# Patient Record
Sex: Female | Born: 1960 | Race: White | Hispanic: No | Marital: Single | Smoking: Never smoker
Health system: Southern US, Community
[De-identification: ages and names within clinical notes are randomized; demographics above are authoritative.]

## PROBLEM LIST (undated history)

## (undated) HISTORY — PX: NO PAST SURGERIES: SHX2092

---

## 2015-02-14 ENCOUNTER — Other Ambulatory Visit: Payer: Self-pay | Admitting: Internal Medicine

## 2015-02-15 ENCOUNTER — Encounter: Payer: Self-pay | Admitting: Internal Medicine

## 2015-02-15 ENCOUNTER — Other Ambulatory Visit: Payer: Self-pay | Admitting: Internal Medicine

## 2015-02-15 DIAGNOSIS — R739 Hyperglycemia, unspecified: Secondary | ICD-10-CM | POA: Insufficient documentation

## 2015-02-15 DIAGNOSIS — E282 Polycystic ovarian syndrome: Secondary | ICD-10-CM | POA: Insufficient documentation

## 2015-02-15 DIAGNOSIS — E785 Hyperlipidemia, unspecified: Secondary | ICD-10-CM | POA: Insufficient documentation

## 2015-02-15 DIAGNOSIS — I10 Essential (primary) hypertension: Secondary | ICD-10-CM

## 2015-02-15 DIAGNOSIS — R7303 Prediabetes: Secondary | ICD-10-CM | POA: Insufficient documentation

## 2015-02-15 DIAGNOSIS — E559 Vitamin D deficiency, unspecified: Secondary | ICD-10-CM

## 2015-04-20 ENCOUNTER — Other Ambulatory Visit: Payer: Self-pay | Admitting: Internal Medicine

## 2015-04-22 ENCOUNTER — Other Ambulatory Visit: Payer: Self-pay | Admitting: Internal Medicine

## 2015-05-27 ENCOUNTER — Other Ambulatory Visit: Payer: Self-pay | Admitting: Internal Medicine

## 2015-05-27 MED ORDER — SIMVASTATIN 20 MG PO TABS: 20.0000 mg | ORAL_TABLET | Freq: Every day | ORAL | Status: DC

## 2015-06-08 ENCOUNTER — Encounter: Payer: Self-pay | Admitting: Internal Medicine

## 2015-06-15 ENCOUNTER — Telehealth: Payer: Self-pay

## 2015-06-15 NOTE — Telephone Encounter (Signed)
She will need to be seen for antibiotics.  If she can not come in to see me then I suggest UC after hours.

## 2015-06-15 NOTE — Telephone Encounter (Signed)
Spoke with pt. Pt. Advised. Patient verbalized understanding. MAH 

## 2015-06-15 NOTE — Telephone Encounter (Signed)
Patient called in states that she was going to come in last week for her CPE but, got rescheduled. She states that for the last 10 days she has had a sinus like infection. States that she has had a cough and is having nasal congestion and sinus pain and pressure and is blowing yellow mucus. She states that she is unable to come in this week because she is currently serving for [employer]. She wanted to know if it would be possible for you to call her in something. [phone]

## 2015-06-27 ENCOUNTER — Other Ambulatory Visit: Payer: Self-pay

## 2015-06-27 MED ORDER — METFORMIN HCL 1000 MG PO TABS: 1000.0000 mg | ORAL_TABLET | Freq: Two times a day (BID) | ORAL | Status: DC

## 2015-06-27 MED ORDER — SPIRONOLACTONE 50 MG PO TABS: 50.0000 mg | ORAL_TABLET | Freq: Two times a day (BID) | ORAL | Status: DC

## 2015-06-27 MED ORDER — SIMVASTATIN 20 MG PO TABS: 20.0000 mg | ORAL_TABLET | Freq: Every day | ORAL | Status: DC

## 2015-06-27 MED ORDER — ENALAPRIL MALEATE 2.5 MG PO TABS: 2.5000 mg | ORAL_TABLET | Freq: Every day | ORAL | Status: DC

## 2015-06-27 NOTE — Telephone Encounter (Signed)
Patient needs refill of medications.

## 2015-06-29 ENCOUNTER — Encounter: Payer: Self-pay | Admitting: Internal Medicine

## 2015-06-29 ENCOUNTER — Ambulatory Visit (INDEPENDENT_AMBULATORY_CARE_PROVIDER_SITE_OTHER): Payer: BLUE CROSS/BLUE SHIELD | Admitting: Internal Medicine

## 2015-06-29 VITALS — BP 140/76 | HR 92 | Ht 63.0 in | Wt 196.0 lb

## 2015-06-29 DIAGNOSIS — Z0000 Encounter for general adult medical examination without abnormal findings: Secondary | ICD-10-CM | POA: Diagnosis not present

## 2015-06-29 DIAGNOSIS — E785 Hyperlipidemia, unspecified: Secondary | ICD-10-CM | POA: Diagnosis not present

## 2015-06-29 DIAGNOSIS — I10 Essential (primary) hypertension: Secondary | ICD-10-CM | POA: Diagnosis not present

## 2015-06-29 DIAGNOSIS — Z1211 Encounter for screening for malignant neoplasm of colon: Secondary | ICD-10-CM | POA: Diagnosis not present

## 2015-06-29 DIAGNOSIS — Z114 Encounter for screening for human immunodeficiency virus [HIV]: Secondary | ICD-10-CM | POA: Diagnosis not present

## 2015-06-29 DIAGNOSIS — E559 Vitamin D deficiency, unspecified: Secondary | ICD-10-CM

## 2015-06-29 DIAGNOSIS — E282 Polycystic ovarian syndrome: Secondary | ICD-10-CM

## 2015-06-29 DIAGNOSIS — Z1159 Encounter for screening for other viral diseases: Secondary | ICD-10-CM

## 2015-06-29 NOTE — Progress Notes (Signed)
Date:  06/29/2015   Name:  Taylor Serrano   DOB:  [birth year]   MRN:  [id number]   Chief Complaint: Annual Exam Taylor Serrano is a 55 y.o. female who presents today for her Complete Annual Exam. She feels fairly well. She reports exercising walking and aerobics. She reports she is sleeping well.  She had her mammogram at [facility] in October and was normal.  She does not perform a regular breast exam.  Her last Pap smear was in 2008 was normal.  She does not want to have further Pap smears.  She also has never had colonoscopy but would consider if stool Hemoccult testing were positive.  Hypertension This is a chronic problem. The current episode started more than 1 year ago. The problem is controlled. Pertinent negatives include no chest pain, headaches, palpitations or shortness of breath. There are no associated agents to hypertension. Past treatments include ACE inhibitors. The current treatment provides significant improvement. There is no history of chronic renal disease.  Hyperlipidemia This is a chronic problem. The current episode started more than 1 year ago. The problem is controlled. Recent lipid tests were reviewed and are normal. She has no history of chronic renal disease or hypothyroidism. Pertinent negatives include no chest pain or shortness of breath. Current antihyperlipidemic treatment includes statins. There are no compliance problems.    PCOS -   She takes oral contraceptives to regulate her menses.  She has no intermenstrual bleeding and no excessive bleeding.  She also takes metformin for borderline elevated blood sugars as well as simvastatin.    Review of Systems  Constitutional: Negative for fever, chills, fatigue and unexpected weight change.  HENT: Negative for hearing loss, tinnitus, trouble swallowing and voice change.   Eyes: Negative for visual disturbance.  Respiratory: Negative for cough, chest tightness and shortness of breath.   Cardiovascular:  Negative for chest pain, palpitations and leg swelling.  Gastrointestinal: Negative for abdominal pain, diarrhea, constipation and blood in stool.  Genitourinary: Negative for dysuria, hematuria, menstrual problem and pelvic pain.  Musculoskeletal: Negative for back pain and arthralgias.  Skin: Negative for color change and rash.  Neurological: Negative for dizziness and headaches.  Hematological: Negative for adenopathy.  Psychiatric/Behavioral: Negative for sleep disturbance and dysphoric mood.    Patient Active Problem List   Diagnosis Date Noted  . Dyslipidemia 02/15/2015  . Essential (primary) hypertension 02/15/2015  . Calcium blood increased 02/15/2015  . Blood glucose elevated 02/15/2015  . Bilateral polycystic ovarian syndrome 02/15/2015  . Avitaminosis D 02/15/2015    Prior to Admission medications   Medication Sig Start Date End Date Taking? Authorizing Provider  Cholecalciferol (VITAMIN D) 2000 UNITS tablet Take by mouth.   Yes Historical Provider, MD  enalapril (VASOTEC) 2.5 MG tablet Take 1 tablet (2.5 mg total) by mouth daily. 06/27/15  Yes Reubin Milan, MD  LEVORA 0.15/30, 28, 0.15-30 MG-MCG tablet TAKE 1 TABLET BY MOUTH EACH DAY 04/22/15  Yes Reubin Milan, MD  metFORMIN (GLUCOPHAGE) 1000 MG tablet Take 1 tablet (1,000 mg total) by mouth 2 (two) times daily. 06/27/15  Yes Reubin Milan, MD  Omega-3 Fatty Acids (FISH OIL EXTRA STRENGTH) 1200 MG CAPS Take by mouth.   Yes Historical Provider, MD  simvastatin (ZOCOR) 20 MG tablet Take 1 tablet (20 mg total) by mouth daily. 06/27/15  Yes Reubin Milan, MD  spironolactone (ALDACTONE) 50 MG tablet Take 1 tablet (50 mg total) by mouth 2 (two) times daily. 06/27/15  Yes Reubin Milan, MD    Allergies  Allergen Reactions  . Penicillins     Other reaction(s): Hives  . Tetracyclines & Related     Other reaction(s): rash    Past Surgical History  Procedure Laterality Date  . No past surgeries      Social  History  Substance Use Topics  . Smoking status: Never Smoker   . Smokeless tobacco: None  . Alcohol Use: No     Medication list has been reviewed and updated.   Physical Exam  Constitutional: She is oriented to person, place, and time. She appears well-developed and well-nourished. No distress.  HENT:  Head: Normocephalic and atraumatic.  Right Ear: Tympanic membrane and ear canal normal.  Left Ear: Tympanic membrane and ear canal normal.  Nose: Right sinus exhibits no maxillary sinus tenderness. Left sinus exhibits no maxillary sinus tenderness.  Mouth/Throat: Uvula is midline and oropharynx is clear and moist.  Eyes: Conjunctivae and EOM are normal. Right eye exhibits no discharge. Left eye exhibits no discharge. No scleral icterus.  Neck: Normal range of motion. Carotid bruit is not present. No erythema present. No thyromegaly present.  Cardiovascular: Normal rate, regular rhythm, normal heart sounds and normal pulses.   Pulmonary/Chest: Effort normal. No respiratory distress. She has no wheezes. Right breast exhibits no mass, no nipple discharge, no skin change and no tenderness. Left breast exhibits no mass, no nipple discharge, no skin change and no tenderness.  Abdominal: Soft. Bowel sounds are normal. There is no hepatosplenomegaly. There is no tenderness. There is no CVA tenderness.  Musculoskeletal: Normal range of motion.  Lymphadenopathy:    She has no cervical adenopathy.    She has no axillary adenopathy.  Neurological: She is alert and oriented to person, place, and time. She has normal reflexes. No cranial nerve deficit or sensory deficit.  Skin: Skin is warm, dry and intact. No rash noted.  Psychiatric: She has a normal mood and affect. Her speech is normal and behavior is normal. Thought content normal.  Nursing note and vitals reviewed.   BP 140/76 mmHg  Pulse 92  Ht  (1.6 m)  Wt 196 lb (88.905 kg)  BMI 34.73 kg/m2  LMP 06/08/2015  Assessment and  Plan: 1. Annual physical exam Recommend diet and regular exercise for weight management Mammogram up to date Also recommend pap and pelvic at next CPX - POCT Urinalysis Dipstick  2. Colon cancer screening - Fecal occult blood, imunochemical Refer for colonoscopy if positive  3. Need for hepatitis C screening test - Hepatitis C antibody  4. Encounter for screening for HIV - HIV antibody  5. Essential (primary) hypertension Fair control - CBC with Differential/Platelet - Comprehensive metabolic panel - TSH  6. Bilateral polycystic ovarian syndrome Continue OCPs and metformin - Hemoglobin A1c  7. Dyslipidemia On statin therapy - Lipid panel  8. Avitaminosis D Continue supplementation - Vitamin D 1,25 dihydroxy   Bari Edward, MD [facility]  06/29/2015

## 2015-06-29 NOTE — Patient Instructions (Signed)
Breast Self-Awareness Practicing breast self-awareness may pick up problems early, prevent significant medical complications, and possibly save your life. By practicing breast self-awareness, you can become familiar with how your breasts look and feel and if your breasts are changing. This allows you to notice changes early. It can also offer you some reassurance that your breast health is good. One way to learn what is normal for your breasts and whether your breasts are changing is to do a breast self-exam. If you find a lump or something that was not present in the past, it is best to contact your caregiver right away. Other findings that should be evaluated by your caregiver include nipple discharge, especially if it is bloody; skin changes or reddening; areas where the skin seems to be pulled in (retracted); or new lumps and bumps. Breast pain is seldom associated with cancer (malignancy), but should also be evaluated by a caregiver. HOW TO PERFORM A BREAST SELF-EXAM The best time to examine your breasts is 5-7 days after your menstrual period is over. During menstruation, the breasts are lumpier, and it may be more difficult to pick up changes. If you do not menstruate, have reached menopause, or had your uterus removed (hysterectomy), you should examine your breasts at regular intervals, such as monthly. If you are breastfeeding, examine your breasts after a feeding or after using a breast pump. Breast implants do not decrease the risk for lumps or tumors, so continue to perform breast self-exams as recommended. Talk to your caregiver about how to determine the difference between the implant and breast tissue. Also, talk about the amount of pressure you should use during the exam. Over time, you will become more familiar with the variations of your breasts and more comfortable with the exam. A breast self-exam requires you to remove all your clothes above the waist. 1. Look at your breasts and nipples.  Stand in front of a mirror in a room with good lighting. With your hands on your hips, push your hands firmly downward. Look for a difference in shape, contour, and size from one breast to the other (asymmetry). Asymmetry includes puckers, dips, or bumps. Also, look for skin changes, such as reddened or scaly areas on the breasts. Look for nipple changes, such as discharge, dimpling, repositioning, or redness. 2. Carefully feel your breasts. This is best done either in the shower or tub while using soapy water or when flat on your back. Place the arm (on the side of the breast you are examining) above your head. Use the pads (not the fingertips) of your three middle fingers on your opposite hand to feel your breasts. Start in the underarm area and use  inch (2 cm) overlapping circles to feel your breast. Use 3 different levels of pressure (light, medium, and firm pressure) at each circle before moving to the next circle. The light pressure is needed to feel the tissue closest to the skin. The medium pressure will help to feel breast tissue a little deeper, while the firm pressure is needed to feel the tissue close to the ribs. Continue the overlapping circles, moving downward over the breast until you feel your ribs below your breast. Then, move one finger-width towards the center of the body. Continue to use the  inch (2 cm) overlapping circles to feel your breast as you move slowly up toward the collar bone (clavicle) near the base of the neck. Continue the up and down exam using all 3 pressures until you reach the   middle of the chest. Do this with each breast, carefully feeling for lumps or changes. 3.  Keep a written record with breast changes or normal findings for each breast. By writing this information down, you do not need to depend only on memory for size, tenderness, or location. Write down where you are in your menstrual cycle, if you are still menstruating. Breast tissue can have some lumps or  thick tissue. However, see your caregiver if you find anything that concerns you.  SEEK MEDICAL CARE IF:  You see a change in shape, contour, or size of your breasts or nipples.   You see skin changes, such as reddened or scaly areas on the breasts or nipples.   You have an unusual discharge from your nipples.   You feel a new lump or unusually thick areas.    This information is not intended to replace advice given to you by your health care provider. Make sure you discuss any questions you have with your health care provider.   Document Released: 05/21/2005 Document Revised: 05/07/2012 Document Reviewed: 09/05/2011 Elsevier Interactive Patient Education 2016 Elsevier Inc.  

## 2015-07-04 ENCOUNTER — Telehealth: Payer: Self-pay

## 2015-07-04 NOTE — Telephone Encounter (Signed)
-----   Message from Reubin Milan, MD sent at 07/03/2015 12:20 PM EST ----- Labs are normal except for pre-diabetes (A1C is up to 6.2).  Need to improve diet and exercise 3-4 times per week.  Please follow up in 6 months for recheck.  Vitamin D is pending.

## 2015-07-04 NOTE — Telephone Encounter (Signed)
Left message for patient to call back  

## 2015-07-05 ENCOUNTER — Telehealth: Payer: Self-pay

## 2015-07-05 LAB — COMPREHENSIVE METABOLIC PANEL
ALBUMIN: 4.7 g/dL (ref 3.5–5.5)
ALK PHOS: 38 IU/L — AB (ref 39–117)
ALT: 14 IU/L (ref 0–32)
AST: 20 IU/L (ref 0–40)
Albumin/Globulin Ratio: 1.8 (ref 1.1–2.5)
BILIRUBIN TOTAL: 0.4 mg/dL (ref 0.0–1.2)
BUN / CREAT RATIO: 13 (ref 9–23)
BUN: 11 mg/dL (ref 6–24)
CHLORIDE: 99 mmol/L (ref 96–106)
CO2: 22 mmol/L (ref 18–29)
CREATININE: 0.82 mg/dL (ref 0.57–1.00)
Calcium: 10.2 mg/dL (ref 8.7–10.2)
GFR calc Af Amer: 95 mL/min/{1.73_m2} (ref >59)
GFR calc non Af Amer: 83 mL/min/{1.73_m2} (ref >59)
GLUCOSE: 94 mg/dL (ref 65–99)
Globulin, Total: 2.6 g/dL (ref 1.5–4.5)
Potassium: 4.6 mmol/L (ref 3.5–5.2)
Sodium: 140 mmol/L (ref 134–144)
Total Protein: 7.3 g/dL (ref 6.0–8.5)

## 2015-07-05 LAB — CBC WITH DIFFERENTIAL/PLATELET
BASOS ABS: 0.1 10*3/uL (ref 0.0–0.2)
Basos: 1 %
EOS (ABSOLUTE): 0.3 10*3/uL (ref 0.0–0.4)
Eos: 4 %
HEMOGLOBIN: 14.3 g/dL (ref 11.1–15.9)
Hematocrit: 42.4 % (ref 34.0–46.6)
IMMATURE GRANS (ABS): 0 10*3/uL (ref 0.0–0.1)
Immature Granulocytes: 0 %
LYMPHS ABS: 2.9 10*3/uL (ref 0.7–3.1)
LYMPHS: 34 %
MCH: 29 pg (ref 26.6–33.0)
MCHC: 33.7 g/dL (ref 31.5–35.7)
MCV: 86 fL (ref 79–97)
MONOCYTES: 8 %
Monocytes Absolute: 0.7 10*3/uL (ref 0.1–0.9)
Neutrophils Absolute: 4.4 10*3/uL (ref 1.4–7.0)
Neutrophils: 53 %
PLATELETS: 518 10*3/uL — AB (ref 150–379)
RBC: 4.93 x10E6/uL (ref 3.77–5.28)
RDW: 12.8 % (ref 12.3–15.4)
WBC: 8.3 10*3/uL (ref 3.4–10.8)

## 2015-07-05 LAB — LIPID PANEL
CHOL/HDL RATIO: 3 ratio (ref 0.0–4.4)
CHOLESTEROL TOTAL: 163 mg/dL (ref 100–199)
HDL: 55 mg/dL (ref >39)
LDL CALC: 82 mg/dL (ref 0–99)
Triglycerides: 132 mg/dL (ref 0–149)
VLDL Cholesterol Cal: 26 mg/dL (ref 5–40)

## 2015-07-05 LAB — VITAMIN D 1,25 DIHYDROXY
Vitamin D 1, 25 (OH)2 Total: 36 pg/mL
Vitamin D2 1, 25 (OH)2: <10 pg/mL
Vitamin D3 1, 25 (OH)2: 36 pg/mL

## 2015-07-05 LAB — HEMOGLOBIN A1C
Est. average glucose Bld gHb Est-mCnc: 131 mg/dL
Hgb A1c MFr Bld: 6.2 % — ABNORMAL HIGH (ref 4.8–5.6)

## 2015-07-05 LAB — HIV ANTIBODY (ROUTINE TESTING W REFLEX): HIV SCREEN 4TH GENERATION: NONREACTIVE

## 2015-07-05 LAB — HEPATITIS C ANTIBODY

## 2015-07-05 LAB — TSH: TSH: 1.26 u[IU]/mL (ref 0.450–4.500)

## 2015-07-05 NOTE — Telephone Encounter (Signed)
Spoke with patient. Patient advised of all results and verbalized understanding. Will call back with any future questions or concerns. MAH  

## 2015-07-05 NOTE — Telephone Encounter (Signed)
-----   Message from Reubin Milan, MD sent at 07/05/2015  7:44 AM EST ----- Labs show borderline low Vitamin D.  I recommend doubling the daily Vitamin D dose.

## 2015-07-19 LAB — FECAL OCCULT BLOOD, IMMUNOCHEMICAL: FECAL OCCULT BLD: NEGATIVE

## 2015-07-20 ENCOUNTER — Telehealth: Payer: Self-pay

## 2015-07-20 NOTE — Telephone Encounter (Signed)
Spoke with patient. Patient advised of all results and verbalized understanding. Will call back with any future questions or concerns. MAH  

## 2015-07-20 NOTE — Telephone Encounter (Signed)
-----   Message from Reubin Milan, MD sent at 07/19/2015  4:50 PM EST ----- Fecal occult blood negative.

## 2015-08-31 ENCOUNTER — Encounter: Payer: Self-pay | Admitting: Internal Medicine

## 2015-12-02 ENCOUNTER — Other Ambulatory Visit: Payer: Self-pay | Admitting: Internal Medicine

## 2016-03-19 ENCOUNTER — Telehealth: Payer: Self-pay

## 2016-03-19 ENCOUNTER — Other Ambulatory Visit: Payer: Self-pay | Admitting: Internal Medicine

## 2016-03-19 DIAGNOSIS — R928 Other abnormal and inconclusive findings on diagnostic imaging of breast: Secondary | ICD-10-CM

## 2016-03-19 HISTORY — DX: Other abnormal and inconclusive findings on diagnostic imaging of breast: R92.8

## 2016-03-19 NOTE — Telephone Encounter (Signed)
Had Mammo at [facility] and they recommend additional views but do not do them there. [place] go [place] so [place] needs order for additional films. Do you have results from this at all in order to know what views are recommended?

## 2016-03-20 ENCOUNTER — Inpatient Hospital Stay
Admission: RE | Admit: 2016-03-20 | Discharge: 2016-03-20 | Disposition: A | Discharge status: Home / Self Care | Payer: Self-pay | Source: Ambulatory Visit | Attending: *Deleted | Admitting: *Deleted

## 2016-03-20 ENCOUNTER — Other Ambulatory Visit: Payer: Self-pay | Admitting: *Deleted

## 2016-03-20 DIAGNOSIS — Z9289 Personal history of other medical treatment: Secondary | ICD-10-CM

## 2016-03-28 ENCOUNTER — Other Ambulatory Visit: Payer: Self-pay | Admitting: *Deleted

## 2016-03-28 ENCOUNTER — Ambulatory Visit
Admission: RE | Admit: 2016-03-28 | Discharge: 2016-03-28 | Disposition: A | Discharge status: Home / Self Care | Payer: Self-pay | Source: Ambulatory Visit | Attending: *Deleted | Admitting: *Deleted

## 2016-03-28 DIAGNOSIS — Z9289 Personal history of other medical treatment: Secondary | ICD-10-CM

## 2016-04-11 ENCOUNTER — Ambulatory Visit
Admission: RE | Admit: 2016-04-11 | Discharge: 2016-04-11 | Disposition: A | Discharge status: Home / Self Care | Payer: BLUE CROSS/BLUE SHIELD | Source: Ambulatory Visit | Attending: Internal Medicine | Admitting: Internal Medicine

## 2016-04-11 ENCOUNTER — Other Ambulatory Visit: Payer: Self-pay | Admitting: Internal Medicine

## 2016-04-11 DIAGNOSIS — R928 Other abnormal and inconclusive findings on diagnostic imaging of breast: Secondary | ICD-10-CM | POA: Insufficient documentation

## 2016-05-08 ENCOUNTER — Other Ambulatory Visit: Payer: Self-pay | Admitting: Internal Medicine

## 2016-05-09 ENCOUNTER — Other Ambulatory Visit: Payer: Self-pay | Admitting: Internal Medicine

## 2016-06-13 ENCOUNTER — Other Ambulatory Visit: Payer: Self-pay | Admitting: Internal Medicine

## 2016-07-04 ENCOUNTER — Ambulatory Visit (INDEPENDENT_AMBULATORY_CARE_PROVIDER_SITE_OTHER): Payer: BLUE CROSS/BLUE SHIELD | Admitting: Internal Medicine

## 2016-07-04 ENCOUNTER — Encounter: Payer: Self-pay | Admitting: Internal Medicine

## 2016-07-04 VITALS — BP 116/86 | HR 94 | Temp 97.6°F | Ht 63.0 in | Wt 204.0 lb

## 2016-07-04 DIAGNOSIS — I10 Essential (primary) hypertension: Secondary | ICD-10-CM | POA: Diagnosis not present

## 2016-07-04 DIAGNOSIS — Z0000 Encounter for general adult medical examination without abnormal findings: Secondary | ICD-10-CM | POA: Diagnosis not present

## 2016-07-04 DIAGNOSIS — E282 Polycystic ovarian syndrome: Secondary | ICD-10-CM | POA: Diagnosis not present

## 2016-07-04 DIAGNOSIS — R739 Hyperglycemia, unspecified: Secondary | ICD-10-CM

## 2016-07-04 DIAGNOSIS — E785 Hyperlipidemia, unspecified: Secondary | ICD-10-CM | POA: Diagnosis not present

## 2016-07-04 DIAGNOSIS — E559 Vitamin D deficiency, unspecified: Secondary | ICD-10-CM

## 2016-07-04 LAB — POCT URINALYSIS DIPSTICK
BILIRUBIN UA: NEGATIVE
Blood, UA: NEGATIVE
Glucose, UA: NEGATIVE
KETONES UA: NEGATIVE
LEUKOCYTES UA: NEGATIVE
Nitrite, UA: NEGATIVE
PH UA: 7
Protein, UA: NEGATIVE
Urobilinogen, UA: 0.2

## 2016-07-04 MED ORDER — SPIRONOLACTONE 50 MG PO TABS: 50.0000 mg | ORAL_TABLET | Freq: Two times a day (BID) | ORAL | 12 refills | Status: DC

## 2016-07-04 MED ORDER — METFORMIN HCL 1000 MG PO TABS: 1000.0000 mg | ORAL_TABLET | Freq: Two times a day (BID) | ORAL | 12 refills | Status: DC

## 2016-07-04 MED ORDER — SIMVASTATIN 20 MG PO TABS: 20.0000 mg | ORAL_TABLET | Freq: Every day | ORAL | 12 refills | Status: DC

## 2016-07-04 NOTE — Progress Notes (Signed)
Date:  07/04/2016   Name:  Taylor Serrano   DOB:  [birth year]   MRN:  [id number]   Chief Complaint: Annual Exam Taylor Serrano is a 56 y.o. female who presents today for her Complete Annual Exam. She feels well. She reports exercising little. She reports she is sleeping well. She declines Pap.  Recent mammogram was required follow up views which were normal.  She declines colonoscopy.  Hypertension  This is a chronic problem. The problem is unchanged. The problem is controlled. Pertinent negatives include no chest pain, headaches, palpitations or shortness of breath.  Hyperlipidemia  This is a chronic problem. Pertinent negatives include no chest pain or shortness of breath. Current antihyperlipidemic treatment includes statins.  PCOS - still on OCP and metformin plus spironolactone.  Periods are regular without excessive bleeding or pain.    Review of Systems  Constitutional: Negative for chills, fatigue, fever and unexpected weight change.  HENT: Negative for ear pain and hearing loss.   Eyes: Negative for visual disturbance.  Respiratory: Negative for choking, chest tightness and shortness of breath.   Cardiovascular: Negative for chest pain, palpitations and leg swelling.  Gastrointestinal: Negative for abdominal pain, constipation and diarrhea.  Endocrine: Negative for polydipsia and polyuria.  Genitourinary: Negative for dyspareunia, hematuria and menstrual problem.  Musculoskeletal: Negative for arthralgias, back pain and gait problem.  Skin: Negative for color change and rash.  Neurological: Negative for dizziness and headaches.  Hematological: Negative for adenopathy. Does not bruise/bleed easily.  Psychiatric/Behavioral: Negative for dysphoric mood and sleep disturbance. The patient is not nervous/anxious.     Patient Active Problem List   Diagnosis Date Noted  . Abnormal mammogram of right breast 03/19/2016  . Dyslipidemia 02/15/2015  . Essential (primary) hypertension  02/15/2015  . Calcium blood increased 02/15/2015  . Blood glucose elevated 02/15/2015  . Bilateral polycystic ovarian syndrome 02/15/2015  . Avitaminosis D 02/15/2015    Prior to Admission medications   Medication Sig Start Date End Date Taking? Authorizing Provider  Cholecalciferol (VITAMIN D) 2000 UNITS tablet Take by mouth.   Yes Historical Provider, MD  enalapril (VASOTEC) 2.5 MG tablet TAKE ONE TABLET BY MOUTH EVERY DAY 05/09/16  Yes Reubin Milan, MD  LEVORA 0.15/30, 28, 0.15-30 MG-MCG tablet TAKE 1 TABLET BY MOUTH EACH DAY 05/09/16  Yes Reubin Milan, MD  metFORMIN (GLUCOPHAGE) 1000 MG tablet TAKE ONE TABLET BY MOUTH TWICE DAILY 06/13/16  Yes Reubin Milan, MD  simvastatin (ZOCOR) 20 MG tablet TAKE ONE TABLET BY MOUTH EVERY DAY 06/13/16  Yes Reubin Milan, MD  spironolactone (ALDACTONE) 50 MG tablet TAKE ONE TABLET BY MOUTH TWICE DAILY 06/13/16  Yes Reubin Milan, MD    Allergies  Allergen Reactions  . Penicillins     Other reaction(s): Hives  . Tetracyclines & Related     Other reaction(s): rash    Past Surgical History:  Procedure Laterality Date  . NO PAST SURGERIES      Social History  Substance Use Topics  . Smoking status: Never Smoker  . Smokeless tobacco: Never Used  . Alcohol use No     Medication list has been reviewed and updated.   Physical Exam  Constitutional: She is oriented to person, place, and time. She appears well-developed and well-nourished. No distress.  HENT:  Head: Normocephalic and atraumatic.  Right Ear: Tympanic membrane and ear canal normal.  Left Ear: Tympanic membrane and ear canal normal.  Nose: Right sinus exhibits no maxillary sinus tenderness.  Left sinus exhibits no maxillary sinus tenderness.  Mouth/Throat: Uvula is midline and oropharynx is clear and moist.  Eyes: Conjunctivae and EOM are normal. Right eye exhibits no discharge. Left eye exhibits no discharge. No scleral icterus.  Neck: Normal range of motion.  Carotid bruit is not present. No erythema present. No thyromegaly present.  Cardiovascular: Normal rate, regular rhythm, normal heart sounds and normal pulses.   Pulmonary/Chest: Effort normal. No respiratory distress. She has no wheezes. Right breast exhibits no mass, no nipple discharge, no skin change and no tenderness. Left breast exhibits no mass, no nipple discharge, no skin change and no tenderness.  Abdominal: Soft. Bowel sounds are normal. There is no hepatosplenomegaly. There is no tenderness. There is no CVA tenderness.  Musculoskeletal: Normal range of motion.  Lymphadenopathy:    She has no cervical adenopathy.    She has no axillary adenopathy.  Neurological: She is alert and oriented to person, place, and time. She has normal reflexes. No cranial nerve deficit or sensory deficit.  Skin: Skin is warm, dry and intact. No rash noted.  Psychiatric: She has a normal mood and affect. Her speech is normal and behavior is normal. Thought content normal.  Nursing note and vitals reviewed.   BP 116/86   Pulse 94   Temp 97.6 F (36.4 C)   Ht 5\' 3"  (1.6 m)   Wt 204 lb (92.5 kg)   SpO2 99%   BMI 36.14 kg/m   Assessment and Plan: 1. Annual physical exam Resume regular exercise Pt declines Pap, colonoscopy, influenza vaccine Continue annual mammogram - POCT urinalysis dipstick  2. Essential (primary) hypertension controlled - CBC with Differential/Platelet - TSH  3. Bilateral polycystic ovarian syndrome Stop OCPs - metFORMIN (GLUCOPHAGE) 1000 MG tablet; Take 1 tablet (1,000 mg total) by mouth 2 (two) times daily.  Dispense: 60 tablet; Refill: 12 - spironolactone (ALDACTONE) 50 MG tablet; Take 1 tablet (50 mg total) by mouth 2 (two) times daily.  Dispense: 60 tablet; Refill: 12 - Comprehensive metabolic panel  4. Dyslipidemia On statin therapy - simvastatin (ZOCOR) 20 MG tablet; Take 1 tablet (20 mg total) by mouth daily.  Dispense: 30 tablet; Refill: 12 - Lipid  panel  5. Blood glucose elevated Continue meformin; check labs - Hemoglobin A1c  6. Avitaminosis D Continue vitamin D supplement daily - VITAMIN D 25 Hydroxy (Vit-D Deficiency, Fractures)   Bari EdwardLaura Gad Aymond, MD [facility]  07/04/2016

## 2016-07-04 NOTE — Patient Instructions (Signed)
Breast Self-Awareness Introduction Breast self-awareness means being familiar with how your breasts look and feel. It involves checking your breasts regularly and reporting any changes to your health care provider. Practicing breast self-awareness is important. A change in your breasts can be a sign of a serious medical problem. Being familiar with how your breasts look and feel allows you to find any problems early, when treatment is more likely to be successful. All women should practice breast self-awareness, including women who have had breast implants. How to do a breast self-exam One way to learn what is normal for your breasts and whether your breasts are changing is to do a breast self-exam. To do a breast self-exam: Look for Changes  1. Remove all the clothing above your waist. 2. Stand in front of a mirror in a room with good lighting. 3. Put your hands on your hips. 4. Push your hands firmly downward. 5. Compare your breasts in the mirror. Look for differences between them (asymmetry), such as:  Differences in shape.  Differences in size.  Puckers, dips, and bumps in one breast and not the other. 6. Look at each breast for changes in your skin, such as:  Redness.  Scaly areas. 7. Look for changes in your nipples, such as:  Discharge.  Bleeding.  Dimpling.  Redness.  A change in position. Feel for Changes  Carefully feel your breasts for lumps and changes. It is best to do this while lying on your back on the floor and again while sitting or standing in the shower or tub with soapy water on your skin. Feel each breast in the following way:  Place the arm on the side of the breast you are examining above your head.  Feel your breast with the other hand.  Start in the nipple area and make  inch (2 cm) overlapping circles to feel your breast. Use the pads of your three middle fingers to do this. Apply light pressure, then medium pressure, then firm pressure. The light  pressure will allow you to feel the tissue closest to the skin. The medium pressure will allow you to feel the tissue that is a little deeper. The firm pressure will allow you to feel the tissue close to the ribs.  Continue the overlapping circles, moving downward over the breast until you feel your ribs below your breast.  Move one finger-width toward the center of the body. Continue to use the  inch (2 cm) overlapping circles to feel your breast as you move slowly up toward your collarbone.  Continue the up and down exam using all three pressures until you reach your armpit. Write Down What You Find  Write down what is normal for each breast and any changes that you find. Keep a written record with breast changes or normal findings for each breast. By writing this information down, you do not need to depend only on memory for size, tenderness, or location. Write down where you are in your menstrual cycle, if you are still menstruating. If you are having trouble noticing differences in your breasts, do not get discouraged. With time you will become more familiar with the variations in your breasts and more comfortable with the exam. How often should I examine my breasts? Examine your breasts every month. If you are breastfeeding, the best time to examine your breasts is after a feeding or after using a breast pump. If you menstruate, the best time to examine your breasts is 5-7 days after your   period is over. During your period, your breasts are lumpier, and it may be more difficult to notice changes. When should I see my health care provider? See your health care provider if you notice:  A change in shape or size of your breasts or nipples.  A change in the skin of your breast or nipples, such as a reddened or scaly area.  Unusual discharge from your nipples.  A lump or thick area that was not there before.  Pain in your breasts.  Anything that concerns you. This information is not  intended to replace advice given to you by your health care provider. Make sure you discuss any questions you have with your health care provider. Document Released: 05/21/2005 Document Revised: 10/27/2015 Document Reviewed: 04/10/2015  2017 Elsevier  

## 2016-07-05 LAB — COMPREHENSIVE METABOLIC PANEL
ALK PHOS: 36 IU/L — AB (ref 39–117)
ALT: 21 IU/L (ref 0–32)
AST: 34 IU/L (ref 0–40)
Albumin/Globulin Ratio: 1.8 (ref 1.2–2.2)
Albumin: 4.7 g/dL (ref 3.5–5.5)
BILIRUBIN TOTAL: 0.3 mg/dL (ref 0.0–1.2)
BUN/Creatinine Ratio: 17 (ref 9–23)
BUN: 13 mg/dL (ref 6–24)
CHLORIDE: 95 mmol/L — AB (ref 96–106)
CO2: 23 mmol/L (ref 18–29)
Calcium: 10.1 mg/dL (ref 8.7–10.2)
Creatinine, Ser: 0.77 mg/dL (ref 0.57–1.00)
GFR calc non Af Amer: 88 mL/min/{1.73_m2} (ref >59)
GFR, EST AFRICAN AMERICAN: 102 mL/min/{1.73_m2} (ref >59)
GLUCOSE: 93 mg/dL (ref 65–99)
Globulin, Total: 2.6 g/dL (ref 1.5–4.5)
Potassium: 5.3 mmol/L — ABNORMAL HIGH (ref 3.5–5.2)
Sodium: 136 mmol/L (ref 134–144)
TOTAL PROTEIN: 7.3 g/dL (ref 6.0–8.5)

## 2016-07-05 LAB — CBC WITH DIFFERENTIAL/PLATELET
BASOS ABS: 0.1 10*3/uL (ref 0.0–0.2)
Basos: 1 %
EOS (ABSOLUTE): 0.4 10*3/uL (ref 0.0–0.4)
Eos: 5 %
Hematocrit: 42.5 % (ref 34.0–46.6)
Hemoglobin: 14.3 g/dL (ref 11.1–15.9)
IMMATURE GRANS (ABS): 0 10*3/uL (ref 0.0–0.1)
Immature Granulocytes: 0 %
LYMPHS: 36 %
Lymphocytes Absolute: 2.9 10*3/uL (ref 0.7–3.1)
MCH: 29.9 pg (ref 26.6–33.0)
MCHC: 33.6 g/dL (ref 31.5–35.7)
MCV: 89 fL (ref 79–97)
Monocytes Absolute: 0.8 10*3/uL (ref 0.1–0.9)
Monocytes: 10 %
NEUTROS ABS: 3.9 10*3/uL (ref 1.4–7.0)
Neutrophils: 48 %
PLATELETS: 400 10*3/uL — AB (ref 150–379)
RBC: 4.78 x10E6/uL (ref 3.77–5.28)
RDW: 12.9 % (ref 12.3–15.4)
WBC: 8.1 10*3/uL (ref 3.4–10.8)

## 2016-07-05 LAB — HEMOGLOBIN A1C
ESTIMATED AVERAGE GLUCOSE: 114 mg/dL
Hgb A1c MFr Bld: 5.6 % (ref 4.8–5.6)

## 2016-07-05 LAB — LIPID PANEL
CHOLESTEROL TOTAL: 157 mg/dL (ref 100–199)
Chol/HDL Ratio: 3.3 ratio units (ref 0.0–4.4)
HDL: 48 mg/dL (ref >39)
LDL Calculated: 89 mg/dL (ref 0–99)
TRIGLYCERIDES: 98 mg/dL (ref 0–149)
VLDL Cholesterol Cal: 20 mg/dL (ref 5–40)

## 2016-07-05 LAB — VITAMIN D 25 HYDROXY (VIT D DEFICIENCY, FRACTURES): Vit D, 25-Hydroxy: 89.8 ng/mL (ref 30.0–100.0)

## 2016-07-05 LAB — TSH: TSH: 1.42 u[IU]/mL (ref 0.450–4.500)

## 2016-07-18 ENCOUNTER — Other Ambulatory Visit: Payer: Self-pay | Admitting: Internal Medicine

## 2017-01-10 ENCOUNTER — Other Ambulatory Visit: Payer: Self-pay

## 2017-01-10 ENCOUNTER — Other Ambulatory Visit: Payer: Self-pay | Admitting: Internal Medicine

## 2017-03-05 ENCOUNTER — Other Ambulatory Visit: Payer: Self-pay | Admitting: Internal Medicine

## 2017-03-05 DIAGNOSIS — Z1231 Encounter for screening mammogram for malignant neoplasm of breast: Secondary | ICD-10-CM

## 2017-04-03 ENCOUNTER — Ambulatory Visit
Admission: RE | Admit: 2017-04-03 | Discharge: 2017-04-03 | Disposition: A | Discharge status: Home / Self Care | Payer: BLUE CROSS/BLUE SHIELD | Source: Ambulatory Visit | Attending: Internal Medicine | Admitting: Internal Medicine

## 2017-04-03 DIAGNOSIS — Z1231 Encounter for screening mammogram for malignant neoplasm of breast: Secondary | ICD-10-CM | POA: Insufficient documentation

## 2017-07-10 ENCOUNTER — Encounter: Payer: BLUE CROSS/BLUE SHIELD | Admitting: Internal Medicine

## 2017-07-17 ENCOUNTER — Encounter: Payer: Self-pay | Admitting: Internal Medicine

## 2017-07-17 ENCOUNTER — Ambulatory Visit (INDEPENDENT_AMBULATORY_CARE_PROVIDER_SITE_OTHER): Payer: BLUE CROSS/BLUE SHIELD | Admitting: Internal Medicine

## 2017-07-17 VITALS — BP 138/80 | HR 99 | Ht 63.0 in | Wt 199.0 lb

## 2017-07-17 DIAGNOSIS — Z1211 Encounter for screening for malignant neoplasm of colon: Secondary | ICD-10-CM

## 2017-07-17 DIAGNOSIS — I10 Essential (primary) hypertension: Secondary | ICD-10-CM | POA: Diagnosis not present

## 2017-07-17 DIAGNOSIS — Z1239 Encounter for other screening for malignant neoplasm of breast: Secondary | ICD-10-CM

## 2017-07-17 DIAGNOSIS — E282 Polycystic ovarian syndrome: Secondary | ICD-10-CM

## 2017-07-17 DIAGNOSIS — Z0001 Encounter for general adult medical examination with abnormal findings: Secondary | ICD-10-CM | POA: Diagnosis not present

## 2017-07-17 DIAGNOSIS — R739 Hyperglycemia, unspecified: Secondary | ICD-10-CM | POA: Diagnosis not present

## 2017-07-17 DIAGNOSIS — E785 Hyperlipidemia, unspecified: Secondary | ICD-10-CM | POA: Diagnosis not present

## 2017-07-17 DIAGNOSIS — Z0000 Encounter for general adult medical examination without abnormal findings: Secondary | ICD-10-CM

## 2017-07-17 DIAGNOSIS — F39 Unspecified mood [affective] disorder: Secondary | ICD-10-CM

## 2017-07-17 LAB — POCT URINALYSIS DIPSTICK
BILIRUBIN UA: NEGATIVE
Blood, UA: NEGATIVE
Glucose, UA: NEGATIVE
Ketones, UA: NEGATIVE
Leukocytes, UA: NEGATIVE
NITRITE UA: NEGATIVE
PH UA: 8 (ref 5.0–8.0)
PROTEIN UA: NEGATIVE
Spec Grav, UA: 1.015 (ref 1.010–1.025)
UROBILINOGEN UA: 0.2 U/dL

## 2017-07-17 MED ORDER — ESCITALOPRAM OXALATE 10 MG PO TABS: 10.0000 mg | ORAL_TABLET | Freq: Every day | ORAL | 5 refills | Status: DC

## 2017-07-17 MED ORDER — METFORMIN HCL 1000 MG PO TABS: 1000.0000 mg | ORAL_TABLET | Freq: Two times a day (BID) | ORAL | 12 refills | Status: DC

## 2017-07-17 MED ORDER — SPIRONOLACTONE 50 MG PO TABS: 50.0000 mg | ORAL_TABLET | Freq: Two times a day (BID) | ORAL | 12 refills | Status: DC

## 2017-07-17 MED ORDER — SIMVASTATIN 20 MG PO TABS: 20.0000 mg | ORAL_TABLET | Freq: Every day | ORAL | 12 refills | Status: DC

## 2017-07-17 NOTE — Progress Notes (Signed)
Date:  07/17/2017   Name:  Taylor Serrano   DOB:  1960-07-27   MRN:  [id number]   Chief Complaint: Annual Exam (Breast Exam. Declined papsmear. ) Taylor Serrano is a 57 y.o. female who presents today for her Complete Annual Exam. She feels fairly well. She reports exercising some. She reports she is sleeping poorly due to elderly dog.  She declines Pap and colonoscopy.  She stopped OCPs last year - no menses but some hot flashes and mood changes.  Mammogram was done in October.  Hypertension  This is a chronic problem. The problem is unchanged. The problem is controlled. Pertinent negatives include no chest pain, headaches, orthopnea, palpitations, peripheral edema or shortness of breath.  Hyperlipidemia  This is a chronic problem. The problem is controlled. Pertinent negatives include no chest pain or shortness of breath. Current antihyperlipidemic treatment includes statins. The current treatment provides significant improvement of lipids.  Mood Disorder - she is having more emotional issues lately. A terminal grandmother, elderly dog and father with Parkinson's disease.  She has taken xanax in the past sporadically. She has never taken SSRIs.   Lab Results  Component Value Date   HGBA1C 5.6 07/04/2016     Review of Systems  Constitutional: Negative for chills, fatigue and fever.  HENT: Negative for congestion, hearing loss, tinnitus, trouble swallowing and voice change.   Eyes: Negative for visual disturbance.  Respiratory: Negative for cough, chest tightness, shortness of breath and wheezing.   Cardiovascular: Negative for chest pain, palpitations, orthopnea and leg swelling.  Gastrointestinal: Negative for abdominal pain, blood in stool, constipation, diarrhea and vomiting.  Endocrine: Negative for polydipsia and polyuria.  Genitourinary: Negative for dysuria, frequency, genital sores, vaginal bleeding and vaginal discharge.  Musculoskeletal: Negative for arthralgias, gait problem  and joint swelling.  Skin: Negative for color change and rash.  Neurological: Negative for dizziness, tremors, light-headedness and headaches.  Hematological: Negative for adenopathy. Does not bruise/bleed easily.  Psychiatric/Behavioral: Positive for sleep disturbance. Negative for dysphoric mood. The patient is nervous/anxious.     Patient Active Problem List   Diagnosis Date Noted  . Abnormal mammogram of right breast 03/19/2016  . Dyslipidemia 02/15/2015  . Essential (primary) hypertension 02/15/2015  . Calcium blood increased 02/15/2015  . Blood glucose elevated 02/15/2015  . Bilateral polycystic ovarian syndrome 02/15/2015  . Avitaminosis D 02/15/2015    Prior to Admission medications   Medication Sig Start Date End Date Taking? Authorizing Provider  Ascorbic Acid (VITAMIN C) 1000 MG tablet Take 1,000 mg by mouth daily.   Yes [provider]  Cholecalciferol (VITAMIN D) 2000 UNITS tablet Take by mouth.   Yes [provider]  enalapril (VASOTEC) 2.5 MG tablet TAKE ONE TABLET BY MOUTH EVERY DAY 01/10/17  Yes Reubin Milan, MD  magnesium 30 MG tablet Take 30 mg by mouth 2 (two) times daily.   Yes [provider]  metFORMIN (GLUCOPHAGE) 1000 MG tablet Take 1 tablet (1,000 mg total) by mouth 2 (two) times daily. 07/04/16  Yes Reubin Milan, MD  simvastatin (ZOCOR) 20 MG tablet Take 1 tablet (20 mg total) by mouth daily. 07/04/16  Yes Reubin Milan, MD  spironolactone (ALDACTONE) 50 MG tablet Take 1 tablet (50 mg total) by mouth 2 (two) times daily. 07/04/16  Yes Reubin Milan, MD    Allergies  Allergen Reactions  . Penicillins     Other reaction(s): Hives  . Tetracyclines & Related     Other reaction(s): rash  Past Surgical History:  Procedure Laterality Date  . NO PAST SURGERIES      Social History   Tobacco Use  . Smoking status: Never Smoker  . Smokeless tobacco: Never Used  Substance Use Topics  . Alcohol use: No     Alcohol/week: 0.0 oz  . Drug use: No     Medication list has been reviewed and updated.  PHQ 2/9 Scores 07/17/2017 07/04/2016  PHQ - 2 Score 2 0  PHQ- 9 Score 2 -    Physical Exam  Constitutional: She is oriented to person, place, and time. She appears well-developed and well-nourished. No distress.  HENT:  Head: Normocephalic and atraumatic.  Right Ear: Tympanic membrane and ear canal normal.  Left Ear: Tympanic membrane and ear canal normal.  Nose: Right sinus exhibits no maxillary sinus tenderness. Left sinus exhibits no maxillary sinus tenderness.  Mouth/Throat: Uvula is midline and oropharynx is clear and moist.  Eyes: Conjunctivae and EOM are normal. Right eye exhibits no discharge. Left eye exhibits no discharge. No scleral icterus.  Neck: Normal range of motion. Carotid bruit is not present. No erythema present. No thyromegaly present.  Cardiovascular: Normal rate, regular rhythm, normal heart sounds and normal pulses.  Pulmonary/Chest: Effort normal. No respiratory distress. She has no wheezes. Right breast exhibits no mass, no nipple discharge, no skin change and no tenderness. Left breast exhibits no mass, no nipple discharge, no skin change and no tenderness.  Abdominal: Soft. Bowel sounds are normal. There is no hepatosplenomegaly. There is no tenderness. There is no CVA tenderness.  Musculoskeletal: Normal range of motion.  Lymphadenopathy:    She has no cervical adenopathy.    She has no axillary adenopathy.  Neurological: She is alert and oriented to person, place, and time. She has normal reflexes. No cranial nerve deficit or sensory deficit.  Skin: Skin is warm, dry and intact. No rash noted.  Psychiatric: She has a normal mood and affect. Her speech is normal and behavior is normal. Thought content normal.  Nursing note and vitals reviewed.   BP 138/80   Pulse 99   Ht 5\' 3"  (1.6 m)   Wt 199 lb (90.3 kg)   SpO2 97%   BMI 35.25 kg/m   Assessment and Plan: 1.  Annual physical exam Recommend regular exercise Pt continues to decline Pap exams - POCT urinalysis dipstick  2. Breast cancer screening - MM DIGITAL SCREENING BILATERAL; Future  3. Essential (primary) hypertension controlled - CBC with Differential/Platelet - TSH  4. Dyslipidemia On statin therapy - simvastatin (ZOCOR) 20 MG tablet; Take 1 tablet (20 mg total) by mouth daily.  Dispense: 30 tablet; Refill: 12 - Comprehensive metabolic panel - Lipid panel  5. Blood glucose elevated Due to PCOS Continue metformin - Hemoglobin A1c  6. Bilateral polycystic ovarian syndrome - metFORMIN (GLUCOPHAGE) 1000 MG tablet; Take 1 tablet (1,000 mg total) by mouth 2 (two) times daily.  Dispense: 60 tablet; Refill: 12 - spironolactone (ALDACTONE) 50 MG tablet; Take 1 tablet (50 mg total) by mouth 2 (two) times daily.  Dispense: 60 tablet; Refill: 12  7. Mood disorder (HCC) - escitalopram (LEXAPRO) 10 MG tablet; Take 1 tablet (10 mg total) by mouth daily.  Dispense: 30 tablet; Refill: 5  8. Colon cancer screening She declines colonoscopy and cologuard - Fecal occult blood, imunochemical   Meds ordered this encounter  Medications  . metFORMIN (GLUCOPHAGE) 1000 MG tablet    Sig: Take 1 tablet (1,000 mg total) by mouth 2 (two)  times daily.    Dispense:  60 tablet    Refill:  12  . simvastatin (ZOCOR) 20 MG tablet    Sig: Take 1 tablet (20 mg total) by mouth daily.    Dispense:  30 tablet    Refill:  12  . spironolactone (ALDACTONE) 50 MG tablet    Sig: Take 1 tablet (50 mg total) by mouth 2 (two) times daily.    Dispense:  60 tablet    Refill:  12  . escitalopram (LEXAPRO) 10 MG tablet    Sig: Take 1 tablet (10 mg total) by mouth daily.    Dispense:  30 tablet    Refill:  5    Partially dictated using Animal nutritionist. Any errors are unintentional.  Bari Edward, MD [facility]  07/17/2017

## 2017-07-17 NOTE — Patient Instructions (Signed)

## 2017-07-18 LAB — COMPREHENSIVE METABOLIC PANEL
ALBUMIN: 5.4 g/dL (ref 3.5–5.5)
ALT: 53 IU/L — ABNORMAL HIGH (ref 0–32)
AST: 46 IU/L — ABNORMAL HIGH (ref 0–40)
Albumin/Globulin Ratio: 1.8 (ref 1.2–2.2)
Alkaline Phosphatase: 55 IU/L (ref 39–117)
BUN/Creatinine Ratio: 11 (ref 9–23)
BUN: 10 mg/dL (ref 6–24)
Bilirubin Total: 0.7 mg/dL (ref 0.0–1.2)
CO2: 25 mmol/L (ref 20–29)
CREATININE: 0.87 mg/dL (ref 0.57–1.00)
Calcium: 10.9 mg/dL — ABNORMAL HIGH (ref 8.7–10.2)
Chloride: 98 mmol/L (ref 96–106)
GFR calc Af Amer: 87 mL/min/{1.73_m2} (ref >59)
GFR calc non Af Amer: 76 mL/min/{1.73_m2} (ref >59)
GLOBULIN, TOTAL: 3 g/dL (ref 1.5–4.5)
Glucose: 87 mg/dL (ref 65–99)
Potassium: 5.2 mmol/L (ref 3.5–5.2)
SODIUM: 141 mmol/L (ref 134–144)
TOTAL PROTEIN: 8.4 g/dL (ref 6.0–8.5)

## 2017-07-18 LAB — TSH: TSH: 1.12 u[IU]/mL (ref 0.450–4.500)

## 2017-07-18 LAB — CBC WITH DIFFERENTIAL/PLATELET
Basophils Absolute: 0.1 10*3/uL (ref 0.0–0.2)
Basos: 1 %
EOS (ABSOLUTE): 0.5 10*3/uL — ABNORMAL HIGH (ref 0.0–0.4)
EOS: 6 %
HEMATOCRIT: 44.2 % (ref 34.0–46.6)
HEMOGLOBIN: 15.8 g/dL (ref 11.1–15.9)
IMMATURE GRANS (ABS): 0 10*3/uL (ref 0.0–0.1)
IMMATURE GRANULOCYTES: 0 %
LYMPHS ABS: 3.2 10*3/uL — AB (ref 0.7–3.1)
Lymphs: 38 %
MCH: 30.5 pg (ref 26.6–33.0)
MCHC: 35.7 g/dL (ref 31.5–35.7)
MCV: 85 fL (ref 79–97)
MONOCYTES: 11 %
Monocytes Absolute: 0.9 10*3/uL (ref 0.1–0.9)
NEUTROS PCT: 44 %
Neutrophils Absolute: 3.7 10*3/uL (ref 1.4–7.0)
Platelets: 384 10*3/uL — ABNORMAL HIGH (ref 150–379)
RBC: 5.18 x10E6/uL (ref 3.77–5.28)
RDW: 13.1 % (ref 12.3–15.4)
WBC: 8.4 10*3/uL (ref 3.4–10.8)

## 2017-07-18 LAB — HEMOGLOBIN A1C
ESTIMATED AVERAGE GLUCOSE: 123 mg/dL
Hgb A1c MFr Bld: 5.9 % — ABNORMAL HIGH (ref 4.8–5.6)

## 2017-07-18 LAB — LIPID PANEL
CHOL/HDL RATIO: 3 ratio (ref 0.0–4.4)
Cholesterol, Total: 166 mg/dL (ref 100–199)
HDL: 55 mg/dL (ref >39)
LDL CALC: 80 mg/dL (ref 0–99)
TRIGLYCERIDES: 156 mg/dL — AB (ref 0–149)
VLDL CHOLESTEROL CAL: 31 mg/dL (ref 5–40)

## 2017-09-18 ENCOUNTER — Ambulatory Visit: Payer: BLUE CROSS/BLUE SHIELD | Admitting: Internal Medicine

## 2018-02-05 ENCOUNTER — Other Ambulatory Visit: Payer: Self-pay

## 2018-02-05 DIAGNOSIS — F39 Unspecified mood [affective] disorder: Secondary | ICD-10-CM

## 2018-02-05 MED ORDER — ESCITALOPRAM OXALATE 10 MG PO TABS: 10.0000 mg | ORAL_TABLET | Freq: Every day | ORAL | 5 refills | Status: DC

## 2018-02-05 MED ORDER — ENALAPRIL MALEATE 2.5 MG PO TABS: 2.5000 mg | ORAL_TABLET | Freq: Every day | ORAL | 5 refills | Status: DC

## 2018-03-12 ENCOUNTER — Ambulatory Visit
Admission: RE | Admit: 2018-03-12 | Discharge: 2018-03-12 | Disposition: A | Discharge status: Home / Self Care | Payer: BLUE CROSS/BLUE SHIELD | Source: Ambulatory Visit | Attending: Internal Medicine | Admitting: Internal Medicine

## 2018-03-12 DIAGNOSIS — Z1239 Encounter for other screening for malignant neoplasm of breast: Secondary | ICD-10-CM | POA: Insufficient documentation

## 2018-07-23 ENCOUNTER — Other Ambulatory Visit: Payer: Self-pay

## 2018-07-23 ENCOUNTER — Ambulatory Visit (INDEPENDENT_AMBULATORY_CARE_PROVIDER_SITE_OTHER): Payer: BLUE CROSS/BLUE SHIELD | Admitting: Internal Medicine

## 2018-07-23 ENCOUNTER — Encounter: Payer: Self-pay | Admitting: Internal Medicine

## 2018-07-23 VITALS — BP 126/64 | HR 78 | Ht 63.0 in | Wt 203.0 lb

## 2018-07-23 DIAGNOSIS — E282 Polycystic ovarian syndrome: Secondary | ICD-10-CM

## 2018-07-23 DIAGNOSIS — Z0000 Encounter for general adult medical examination without abnormal findings: Secondary | ICD-10-CM

## 2018-07-23 DIAGNOSIS — E785 Hyperlipidemia, unspecified: Secondary | ICD-10-CM | POA: Diagnosis not present

## 2018-07-23 DIAGNOSIS — Z1231 Encounter for screening mammogram for malignant neoplasm of breast: Secondary | ICD-10-CM

## 2018-07-23 DIAGNOSIS — I10 Essential (primary) hypertension: Secondary | ICD-10-CM | POA: Diagnosis not present

## 2018-07-23 DIAGNOSIS — F39 Unspecified mood [affective] disorder: Secondary | ICD-10-CM | POA: Diagnosis not present

## 2018-07-23 DIAGNOSIS — Z1211 Encounter for screening for malignant neoplasm of colon: Secondary | ICD-10-CM | POA: Diagnosis not present

## 2018-07-23 DIAGNOSIS — R739 Hyperglycemia, unspecified: Secondary | ICD-10-CM

## 2018-07-23 LAB — POCT URINALYSIS DIPSTICK
BILIRUBIN UA: NEGATIVE
Blood, UA: NEGATIVE
GLUCOSE UA: NEGATIVE
KETONES UA: NEGATIVE
Leukocytes, UA: NEGATIVE
Nitrite, UA: NEGATIVE
PH UA: 7 (ref 5.0–8.0)
Protein, UA: NEGATIVE
Spec Grav, UA: <=1.005 — AB (ref 1.010–1.025)
UROBILINOGEN UA: 0.2 U/dL

## 2018-07-23 MED ORDER — METFORMIN HCL 1000 MG PO TABS: 1000.0000 mg | ORAL_TABLET | Freq: Two times a day (BID) | ORAL | 12 refills | Status: DC

## 2018-07-23 MED ORDER — ESCITALOPRAM OXALATE 10 MG PO TABS: 10.0000 mg | ORAL_TABLET | Freq: Every day | ORAL | 5 refills | Status: DC

## 2018-07-23 MED ORDER — SIMVASTATIN 20 MG PO TABS: 20.0000 mg | ORAL_TABLET | Freq: Every day | ORAL | 12 refills | Status: DC

## 2018-07-23 MED ORDER — SPIRONOLACTONE 50 MG PO TABS: 50.0000 mg | ORAL_TABLET | Freq: Two times a day (BID) | ORAL | 12 refills | Status: DC

## 2018-07-23 MED ORDER — ENALAPRIL MALEATE 2.5 MG PO TABS: 2.5000 mg | ORAL_TABLET | Freq: Every day | ORAL | 5 refills | Status: DC

## 2018-07-23 NOTE — Patient Instructions (Signed)

## 2018-07-23 NOTE — Progress Notes (Signed)
[place]    Date:  07/23/2018   Name:  Taylor Serrano   DOB:  06/18/1960   MRN:  [id number]   Chief Complaint: Annual Exam (Breast Exam. Declines pap.) Taylor Serrano is a 58 y.o. female who presents today for her Complete Annual Exam. She feels well. She reports exercising some. She reports she is sleeping well.  Mammogram is up to date. She has not had a colonoscopy. She declines pap smears because she has no sx.  Hypertension  This is a chronic problem. The problem is controlled. Pertinent negatives include no chest pain, headaches, palpitations or shortness of breath. Past treatments include ACE inhibitors. The current treatment provides significant improvement.  Hyperlipidemia  The problem is controlled. Pertinent negatives include no chest pain or shortness of breath. Current antihyperlipidemic treatment includes statins. The current treatment provides significant improvement of lipids.  PCOS - continues on metformin and spironolactone.  Review of Systems  Constitutional: Negative for chills, fatigue and fever.  HENT: Negative for congestion, hearing loss, tinnitus, trouble swallowing and voice change.   Eyes: Negative for visual disturbance.  Respiratory: Negative for cough, chest tightness, shortness of breath and wheezing.   Cardiovascular: Negative for chest pain, palpitations and leg swelling.  Gastrointestinal: Negative for abdominal pain, constipation, diarrhea and vomiting.  Endocrine: Negative for polydipsia and polyuria.  Genitourinary: Negative for dysuria, frequency, genital sores, vaginal bleeding and vaginal discharge.  Musculoskeletal: Positive for back pain. Negative for arthralgias, gait problem and joint swelling.  Skin: Negative for color change and rash.  Neurological: Negative for dizziness, tremors, light-headedness and headaches.  Hematological: Negative for adenopathy. Does not bruise/bleed easily.  Psychiatric/Behavioral: Negative for dysphoric mood and  sleep disturbance. The patient is not nervous/anxious.     Patient Active Problem List   Diagnosis Date Noted  . Mood disorder (HCC) 07/23/2018  . Abnormal mammogram of right breast 03/19/2016  . Dyslipidemia 02/15/2015  . Essential (primary) hypertension 02/15/2015  . Calcium blood increased 02/15/2015  . Blood glucose elevated 02/15/2015  . Bilateral polycystic ovarian syndrome 02/15/2015  . Avitaminosis D 02/15/2015    Allergies  Allergen Reactions  . Penicillins     Other reaction(s): Hives  . Tetracyclines & Related     Other reaction(s): rash    Past Surgical History:  Procedure Laterality Date  . NO PAST SURGERIES      Social History   Tobacco Use  . Smoking status: Never Smoker  . Smokeless tobacco: Never Used  Substance Use Topics  . Alcohol use: No    Alcohol/week: 0.0 standard drinks  . Drug use: No     Medication list has been reviewed and updated.  Current Meds  Medication Sig  . Ascorbic Acid (VITAMIN C) 1000 MG tablet Take 1,000 mg by mouth daily.  . Cholecalciferol (VITAMIN D) 2000 UNITS tablet Take by mouth.  . enalapril (VASOTEC) 2.5 MG tablet Take 1 tablet (2.5 mg total) by mouth daily.  Marland Kitchen. escitalopram (LEXAPRO) 10 MG tablet Take 1 tablet (10 mg total) by mouth daily.  . magnesium 30 MG tablet Take 30 mg by mouth 2 (two) times daily.  . metFORMIN (GLUCOPHAGE) 1000 MG tablet Take 1 tablet (1,000 mg total) by mouth 2 (two) times daily.  . simvastatin (ZOCOR) 20 MG tablet Take 1 tablet (20 mg total) by mouth daily.  Marland Kitchen. spironolactone (ALDACTONE) 50 MG tablet Take 1 tablet (50 mg total) by mouth 2 (two) times daily.    PHQ 2/9 Scores 07/23/2018 07/17/2017 07/04/2016  PHQ - 2 Score 0 2 0  PHQ- 9 Score - 2 -   Wt Readings from Last 3 Encounters:  07/23/18 203 lb (92.1 kg)  07/17/17 199 lb (90.3 kg)  07/04/16 204 lb (92.5 kg)    Physical Exam Vitals signs and nursing note reviewed.  Constitutional:      General: She is not in acute  distress.    Appearance: She is well-developed.  HENT:     Head: Normocephalic and atraumatic.     Right Ear: Tympanic membrane and ear canal normal.     Left Ear: Tympanic membrane and ear canal normal.     Nose: Nose normal.     Right Sinus: No maxillary sinus tenderness.     Left Sinus: No maxillary sinus tenderness.     Mouth/Throat:     Mouth: Mucous membranes are moist.     Pharynx: Uvula midline.  Eyes:     General: No scleral icterus.       Right eye: No discharge.        Left eye: No discharge.     Conjunctiva/sclera: Conjunctivae normal.  Neck:     Musculoskeletal: Normal range of motion. No erythema.     Thyroid: No thyromegaly.     Vascular: No carotid bruit.  Cardiovascular:     Rate and Rhythm: Normal rate and regular rhythm.     Pulses: Normal pulses.     Heart sounds: Normal heart sounds.  Pulmonary:     Effort: Pulmonary effort is normal. No respiratory distress.     Breath sounds: No wheezing.  Chest:     Breasts:        Right: No mass, nipple discharge, skin change or tenderness.        Left: No mass, nipple discharge, skin change or tenderness.  Abdominal:     General: Bowel sounds are normal.     Palpations: Abdomen is soft.     Tenderness: There is no abdominal tenderness.  Musculoskeletal: Normal range of motion.     Right lower leg: No edema.     Left lower leg: No edema.  Lymphadenopathy:     Cervical: No cervical adenopathy.  Skin:    General: Skin is warm and dry.     Findings: No rash.  Neurological:     Mental Status: She is alert and oriented to person, place, and time.     Cranial Nerves: No cranial nerve deficit.     Sensory: No sensory deficit.     Deep Tendon Reflexes: Reflexes are normal and symmetric.  Psychiatric:        Speech: Speech normal.        Behavior: Behavior normal.        Thought Content: Thought content normal.     BP 126/64   Pulse 78   Ht 5\' 3"  (1.6 m)   Wt 203 lb (92.1 kg)   LMP 04/05/2016   SpO2 97%    BMI 35.96 kg/m   Assessment and Plan: 1. Annual physical exam Normal exam except for weight Encourage regular exercise and dietary changes - POCT urinalysis dipstick  2. Encounter for screening mammogram for breast cancer - MM 3D SCREEN BREAST BILATERAL; Future  3. Essential (primary) hypertension controlled - CBC with Differential/Platelet - Comprehensive metabolic panel - enalapril (VASOTEC) 2.5 MG tablet; Take 1 tablet (2.5 mg total) by mouth daily.  Dispense: 30 tablet; Refill: 5  4. Mood disorder (HCC) Doing well on medications - escitalopram (LEXAPRO)  10 MG tablet; Take 1 tablet (10 mg total) by mouth daily.  Dispense: 30 tablet; Refill: 5  5. Bilateral polycystic ovarian syndrome Continue current medications - metFORMIN (GLUCOPHAGE) 1000 MG tablet; Take 1 tablet (1,000 mg total) by mouth 2 (two) times daily.  Dispense: 60 tablet; Refill: 12 - spironolactone (ALDACTONE) 50 MG tablet; Take 1 tablet (50 mg total) by mouth 2 (two) times daily.  Dispense: 60 tablet; Refill: 12  6. Dyslipidemia On statin therapy - Lipid panel - simvastatin (ZOCOR) 20 MG tablet; Take 1 tablet (20 mg total) by mouth daily.  Dispense: 30 tablet; Refill: 12  7. Blood glucose elevated - Hemoglobin A1c  8. Colon cancer screening Pt declines colonoscopy and cologuard but will do FIT - Fecal occult blood, imunochemical   Partially dictated using Animal nutritionist. Any errors are unintentional.  Bari Edward, MD [facility]  07/23/2018

## 2018-07-24 LAB — COMPREHENSIVE METABOLIC PANEL WITH GFR
ALT: 24 IU/L (ref 0–32)
AST: 23 IU/L (ref 0–40)
Albumin/Globulin Ratio: 2.1 (ref 1.2–2.2)
Albumin: 5.1 g/dL — ABNORMAL HIGH (ref 3.8–4.9)
Alkaline Phosphatase: 51 IU/L (ref 39–117)
BUN/Creatinine Ratio: 20 (ref 9–23)
BUN: 17 mg/dL (ref 6–24)
Bilirubin Total: 0.6 mg/dL (ref 0.0–1.2)
CO2: 23 mmol/L (ref 20–29)
Calcium: 10.2 mg/dL (ref 8.7–10.2)
Chloride: 99 mmol/L (ref 96–106)
Creatinine, Ser: 0.84 mg/dL (ref 0.57–1.00)
GFR calc Af Amer: 90 mL/min/1.73
GFR calc non Af Amer: 78 mL/min/1.73
Globulin, Total: 2.4 g/dL (ref 1.5–4.5)
Glucose: 87 mg/dL (ref 65–99)
Potassium: 5.1 mmol/L (ref 3.5–5.2)
Sodium: 142 mmol/L (ref 134–144)
Total Protein: 7.5 g/dL (ref 6.0–8.5)

## 2018-07-24 LAB — CBC WITH DIFFERENTIAL/PLATELET
Basophils Absolute: 0.1 10*3/uL (ref 0.0–0.2)
Basos: 2 %
EOS (ABSOLUTE): 0.3 10*3/uL (ref 0.0–0.4)
Eos: 4 %
Hematocrit: 43.9 % (ref 34.0–46.6)
Hemoglobin: 15.4 g/dL (ref 11.1–15.9)
IMMATURE GRANULOCYTES: 0 %
Immature Grans (Abs): 0 10*3/uL (ref 0.0–0.1)
Lymphocytes Absolute: 2.6 10*3/uL (ref 0.7–3.1)
Lymphs: 36 %
MCH: 29.9 pg (ref 26.6–33.0)
MCHC: 35.1 g/dL (ref 31.5–35.7)
MCV: 85 fL (ref 79–97)
MONOS ABS: 0.7 10*3/uL (ref 0.1–0.9)
Monocytes: 10 %
NEUTROS PCT: 48 %
Neutrophils Absolute: 3.4 10*3/uL (ref 1.4–7.0)
PLATELETS: 400 10*3/uL (ref 150–450)
RBC: 5.15 x10E6/uL (ref 3.77–5.28)
RDW: 11.9 % (ref 11.7–15.4)
WBC: 7.1 10*3/uL (ref 3.4–10.8)

## 2018-07-24 LAB — LIPID PANEL
Chol/HDL Ratio: 3.4 ratio (ref 0.0–4.4)
Cholesterol, Total: 155 mg/dL (ref 100–199)
HDL: 45 mg/dL
LDL Calculated: 79 mg/dL (ref 0–99)
Triglycerides: 153 mg/dL — ABNORMAL HIGH (ref 0–149)
VLDL Cholesterol Cal: 31 mg/dL (ref 5–40)

## 2018-07-24 LAB — HEMOGLOBIN A1C
Est. average glucose Bld gHb Est-mCnc: 120 mg/dL
Hgb A1c MFr Bld: 5.8 % — ABNORMAL HIGH (ref 4.8–5.6)

## 2018-08-10 LAB — FECAL OCCULT BLOOD, IMMUNOCHEMICAL: FECAL OCCULT BLD: NEGATIVE

## 2019-03-14 ENCOUNTER — Other Ambulatory Visit: Payer: Self-pay | Admitting: Internal Medicine

## 2019-03-14 DIAGNOSIS — I10 Essential (primary) hypertension: Secondary | ICD-10-CM

## 2019-03-20 ENCOUNTER — Ambulatory Visit
Admission: RE | Admit: 2019-03-20 | Discharge: 2019-03-20 | Disposition: A | Discharge status: Home / Self Care | Payer: BLUE CROSS/BLUE SHIELD | Source: Ambulatory Visit | Attending: Internal Medicine | Admitting: Internal Medicine

## 2019-03-20 DIAGNOSIS — Z1231 Encounter for screening mammogram for malignant neoplasm of breast: Secondary | ICD-10-CM | POA: Insufficient documentation

## 2019-05-07 ENCOUNTER — Other Ambulatory Visit: Payer: Self-pay

## 2019-05-07 DIAGNOSIS — F39 Unspecified mood [affective] disorder: Secondary | ICD-10-CM

## 2019-05-07 MED ORDER — ESCITALOPRAM OXALATE 10 MG PO TABS: 10.0000 mg | ORAL_TABLET | Freq: Every day | ORAL | 2 refills | Status: DC

## 2019-07-29 ENCOUNTER — Encounter: Payer: BLUE CROSS/BLUE SHIELD | Admitting: Internal Medicine

## 2019-08-13 ENCOUNTER — Other Ambulatory Visit: Payer: Self-pay | Admitting: Internal Medicine

## 2019-08-13 DIAGNOSIS — F39 Unspecified mood [affective] disorder: Secondary | ICD-10-CM

## 2019-08-13 DIAGNOSIS — E282 Polycystic ovarian syndrome: Secondary | ICD-10-CM

## 2019-08-13 DIAGNOSIS — E785 Hyperlipidemia, unspecified: Secondary | ICD-10-CM

## 2019-08-14 ENCOUNTER — Encounter: Payer: Self-pay | Admitting: Internal Medicine

## 2019-08-14 ENCOUNTER — Ambulatory Visit (INDEPENDENT_AMBULATORY_CARE_PROVIDER_SITE_OTHER): Payer: 59 | Admitting: Internal Medicine

## 2019-08-14 ENCOUNTER — Other Ambulatory Visit: Payer: Self-pay

## 2019-08-14 VITALS — BP 120/66 | HR 75 | Temp 98.5°F | Resp 20 | Ht 63.0 in | Wt 200.0 lb

## 2019-08-14 DIAGNOSIS — Z1231 Encounter for screening mammogram for malignant neoplasm of breast: Secondary | ICD-10-CM

## 2019-08-14 DIAGNOSIS — F39 Unspecified mood [affective] disorder: Secondary | ICD-10-CM | POA: Diagnosis not present

## 2019-08-14 DIAGNOSIS — Z1211 Encounter for screening for malignant neoplasm of colon: Secondary | ICD-10-CM

## 2019-08-14 DIAGNOSIS — R739 Hyperglycemia, unspecified: Secondary | ICD-10-CM | POA: Diagnosis not present

## 2019-08-14 DIAGNOSIS — E785 Hyperlipidemia, unspecified: Secondary | ICD-10-CM

## 2019-08-14 DIAGNOSIS — I10 Essential (primary) hypertension: Secondary | ICD-10-CM

## 2019-08-14 DIAGNOSIS — Z0000 Encounter for general adult medical examination without abnormal findings: Secondary | ICD-10-CM

## 2019-08-14 LAB — POCT URINALYSIS DIPSTICK
Bilirubin, UA: NEGATIVE
Blood, UA: NEGATIVE
Glucose, UA: NEGATIVE
Ketones, UA: NEGATIVE
Leukocytes, UA: NEGATIVE
Nitrite, UA: NEGATIVE
Protein, UA: NEGATIVE
Spec Grav, UA: <=1.005 — AB (ref 1.010–1.025)
Urobilinogen, UA: 0.2 E.U./dL
pH, UA: 8.5 — AB (ref 5.0–8.0)

## 2019-08-14 MED ORDER — ENALAPRIL MALEATE 2.5 MG PO TABS: 2.5000 mg | ORAL_TABLET | Freq: Every day | ORAL | 12 refills | Status: DC

## 2019-08-14 NOTE — Progress Notes (Signed)
Date:  08/14/2019   Name:  Taylor Serrano   DOB:  04-18-1961   MRN:  [id number]   Chief Complaint: Annual Exam (no complaints) Taylor Serrano is a 59 y.o. female who presents today for her Complete Annual Exam. She feels well. She reports exercising some. She reports she is sleeping fairly well. She denies breast issues.  Mammogram 03/2019 Pap discontinued Colonoscopy - none; doing FIT yearly  There is no immunization history on file for this patient.  Hypertension This is a chronic problem. The problem is controlled (at home normal reading). Pertinent negatives include no chest pain, headaches, palpitations, peripheral edema or shortness of breath. Past treatments include ACE inhibitors and diuretics. The current treatment provides significant improvement. There are no compliance problems.   Hyperlipidemia The problem is controlled. Pertinent negatives include no chest pain or shortness of breath. Current antihyperlipidemic treatment includes statins. The current treatment provides significant improvement of lipids. There are no compliance problems.   Depression        This is a chronic problem.The problem is unchanged.  Associated symptoms include no fatigue and no headaches.  Past treatments include SSRIs - Selective serotonin reuptake inhibitors.  Compliance with treatment is good. PCOS - borderline elevated A1Cs; on metformin, spironolactone.  Lab Results  Component Value Date   CREATININE 0.84 07/23/2018   BUN 17 07/23/2018   NA 142 07/23/2018   K 5.1 07/23/2018   CL 99 07/23/2018   CO2 23 07/23/2018   Lab Results  Component Value Date   CHOL 155 07/23/2018   HDL 45 07/23/2018   LDLCALC 79 07/23/2018   TRIG 153 (H) 07/23/2018   CHOLHDL 3.4 07/23/2018   Lab Results  Component Value Date   TSH 1.120 07/17/2017   Lab Results  Component Value Date   HGBA1C 5.8 (H) 07/23/2018     Review of Systems  Constitutional: Negative for chills, fatigue and fever.  HENT:  Negative for congestion, hearing loss, tinnitus, trouble swallowing and voice change.   Eyes: Negative for visual disturbance.  Respiratory: Negative for cough, chest tightness, shortness of breath and wheezing.   Cardiovascular: Negative for chest pain, palpitations and leg swelling.  Gastrointestinal: Negative for abdominal pain, constipation, diarrhea and vomiting.  Endocrine: Negative for polydipsia and polyuria.  Genitourinary: Negative for dysuria, frequency, genital sores, vaginal bleeding and vaginal discharge.  Musculoskeletal: Negative for arthralgias, gait problem and joint swelling.  Skin: Negative for color change and rash.  Neurological: Negative for dizziness, tremors, light-headedness and headaches.  Hematological: Negative for adenopathy. Does not bruise/bleed easily.  Psychiatric/Behavioral: Positive for depression. Negative for dysphoric mood and sleep disturbance. The patient is not nervous/anxious.     Patient Active Problem List   Diagnosis Date Noted  . Mood disorder (HCC) 07/23/2018  . Abnormal mammogram of right breast 03/19/2016  . Dyslipidemia 02/15/2015  . Essential (primary) hypertension 02/15/2015  . Calcium blood increased 02/15/2015  . Blood glucose elevated 02/15/2015  . Bilateral polycystic ovarian syndrome 02/15/2015  . Avitaminosis D 02/15/2015    Allergies  Allergen Reactions  . Penicillins     Other reaction(s): Hives  . Tetracyclines & Related     Other reaction(s): rash    Past Surgical History:  Procedure Laterality Date  . NO PAST SURGERIES      Social History   Tobacco Use  . Smoking status: Never Smoker  . Smokeless tobacco: Never Used  Substance Use Topics  . Alcohol use: No    Alcohol/week:  0.0 standard drinks  . Drug use: No     Medication list has been reviewed and updated.  Current Meds  Medication Sig  . Ascorbic Acid (VITAMIN C) 1000 MG tablet Take 1,000 mg by mouth daily.  . Cholecalciferol (VITAMIN D) 2000  UNITS tablet Take by mouth.  . enalapril (VASOTEC) 2.5 MG tablet TAKE ONE TABLET BY MOUTH ONCE DAILY  . escitalopram (LEXAPRO) 10 MG tablet TAKE ONE TABLET BY MOUTH ONCE DAILY  . magnesium 30 MG tablet Take 30 mg by mouth 2 (two) times daily.  . metFORMIN (GLUCOPHAGE) 1000 MG tablet TAKE ONE TABLET BY MOUTH TWICE DAILY  . simvastatin (ZOCOR) 20 MG tablet TAKE ONE TABLET BY MOUTH ONCE DAILY  . spironolactone (ALDACTONE) 50 MG tablet TAKE ONE TABLET BY MOUTH TWICE DAILY    PHQ 2/9 Scores 08/14/2019 07/23/2018 07/17/2017 07/04/2016  PHQ - 2 Score 0 0 2 0  PHQ- 9 Score 0 - 2 -    BP Readings from Last 3 Encounters:  08/14/19 120/66  07/23/18 126/64  07/17/17 138/80    Physical Exam Vitals and nursing note reviewed.  Constitutional:      General: She is not in acute distress.    Appearance: She is well-developed.  HENT:     Head: Normocephalic and atraumatic.     Right Ear: Tympanic membrane and ear canal normal.     Left Ear: Tympanic membrane and ear canal normal.     Nose:     Right Sinus: No maxillary sinus tenderness.     Left Sinus: No maxillary sinus tenderness.  Eyes:     General: No scleral icterus.       Right eye: No discharge.        Left eye: No discharge.     Conjunctiva/sclera: Conjunctivae normal.  Neck:     Thyroid: No thyromegaly.     Vascular: No carotid bruit.  Cardiovascular:     Rate and Rhythm: Normal rate and regular rhythm.     Pulses: Normal pulses.     Heart sounds: Normal heart sounds.  Pulmonary:     Effort: Pulmonary effort is normal. No respiratory distress.     Breath sounds: No wheezing.  Chest:     Breasts:        Right: No mass, nipple discharge, skin change or tenderness.        Left: No mass, nipple discharge, skin change or tenderness.  Abdominal:     General: Bowel sounds are normal.     Palpations: Abdomen is soft.     Tenderness: There is no abdominal tenderness.  Musculoskeletal:        General: Normal range of motion.      Cervical back: Normal range of motion. No erythema.     Right lower leg: No edema.     Left lower leg: No edema.  Lymphadenopathy:     Cervical: No cervical adenopathy.  Skin:    General: Skin is warm and dry.     Capillary Refill: Capillary refill takes less than 2 seconds.     Findings: No rash.  Neurological:     General: No focal deficit present.     Mental Status: She is alert and oriented to person, place, and time.     Cranial Nerves: No cranial nerve deficit.     Sensory: No sensory deficit.     Deep Tendon Reflexes: Reflexes are normal and symmetric.  Psychiatric:        Attention  and Perception: Attention normal.        Mood and Affect: Mood normal.        Speech: Speech normal.        Behavior: Behavior normal.     Wt Readings from Last 3 Encounters:  08/14/19 200 lb (90.7 kg)  07/23/18 203 lb (92.1 kg)  07/17/17 199 lb (90.3 kg)    BP 120/66   Pulse 75   Temp 98.5 F (36.9 C) (Oral)   Resp 20   Ht 5\' 3"  (1.6 m)   Wt 200 lb (90.7 kg)   LMP 04/05/2016   SpO2 97%   BMI 35.43 kg/m   Assessment and Plan: 1. Annual physical exam Normal exam except for weight Continue efforts at diet and exercise - POCT urinalysis dipstick  2. Encounter for screening mammogram for breast cancer - MM 3D SCREEN BREAST BILATERAL; Future  3. Essential (primary) hypertension Clinically stable exam with well controlled BP. Tolerating medications without side effects at this time. Pt to continue current regimen and low sodium diet; benefits of regular exercise as able discussed. - CBC with Differential/Platelet - enalapril (VASOTEC) 2.5 MG tablet; Take 1 tablet (2.5 mg total) by mouth daily.  Dispense: 30 tablet; Refill: 12  4. Dyslipidemia Check labs and advise on medication - Lipid panel  5. Blood glucose elevated - Comprehensive metabolic panel - Hemoglobin A1c  6. Mood disorder (HCC) Clinically stable on current regimen with good control of symptoms, No SI or  HI. Will continue current therapy. - TSH  7. Colon cancer screening - Fecal occult blood, imunochemical   Partially dictated using Editor, commissioning. Any errors are unintentional.  Halina Maidens, MD [facility]  08/14/2019

## 2019-08-15 LAB — COMPREHENSIVE METABOLIC PANEL
ALT: 23 IU/L (ref 0–32)
AST: 22 IU/L (ref 0–40)
Albumin/Globulin Ratio: 2.1 (ref 1.2–2.2)
Albumin: 5.1 g/dL — ABNORMAL HIGH (ref 3.8–4.9)
Alkaline Phosphatase: 51 IU/L (ref 39–117)
BUN/Creatinine Ratio: 17 (ref 9–23)
BUN: 15 mg/dL (ref 6–24)
Bilirubin Total: 0.6 mg/dL (ref 0.0–1.2)
CO2: 23 mmol/L (ref 20–29)
Calcium: 10.1 mg/dL (ref 8.7–10.2)
Chloride: 101 mmol/L (ref 96–106)
Creatinine, Ser: 0.88 mg/dL (ref 0.57–1.00)
GFR calc Af Amer: 85 mL/min/{1.73_m2} (ref >59)
GFR calc non Af Amer: 74 mL/min/{1.73_m2} (ref >59)
Globulin, Total: 2.4 g/dL (ref 1.5–4.5)
Glucose: 97 mg/dL (ref 65–99)
Potassium: 4.8 mmol/L (ref 3.5–5.2)
Sodium: 140 mmol/L (ref 134–144)
Total Protein: 7.5 g/dL (ref 6.0–8.5)

## 2019-08-15 LAB — CBC WITH DIFFERENTIAL/PLATELET
Basophils Absolute: 0.1 10*3/uL (ref 0.0–0.2)
Basos: 2 %
EOS (ABSOLUTE): 0.3 10*3/uL (ref 0.0–0.4)
Eos: 5 %
Hematocrit: 45.3 % (ref 34.0–46.6)
Hemoglobin: 15.5 g/dL (ref 11.1–15.9)
Immature Grans (Abs): 0 10*3/uL (ref 0.0–0.1)
Immature Granulocytes: 0 %
Lymphocytes Absolute: 2.8 10*3/uL (ref 0.7–3.1)
Lymphs: 37 %
MCH: 30.4 pg (ref 26.6–33.0)
MCHC: 34.2 g/dL (ref 31.5–35.7)
MCV: 89 fL (ref 79–97)
Monocytes Absolute: 0.7 10*3/uL (ref 0.1–0.9)
Monocytes: 9 %
Neutrophils Absolute: 3.5 10*3/uL (ref 1.4–7.0)
Neutrophils: 47 %
Platelets: 408 10*3/uL (ref 150–450)
RBC: 5.1 x10E6/uL (ref 3.77–5.28)
RDW: 12.2 % (ref 11.7–15.4)
WBC: 7.4 10*3/uL (ref 3.4–10.8)

## 2019-08-15 LAB — LIPID PANEL
Chol/HDL Ratio: 3.1 ratio (ref 0.0–4.4)
Cholesterol, Total: 145 mg/dL (ref 100–199)
HDL: 47 mg/dL (ref >39)
LDL Chol Calc (NIH): 74 mg/dL (ref 0–99)
Triglycerides: 134 mg/dL (ref 0–149)
VLDL Cholesterol Cal: 24 mg/dL (ref 5–40)

## 2019-08-15 LAB — HEMOGLOBIN A1C
Est. average glucose Bld gHb Est-mCnc: 123 mg/dL
Hgb A1c MFr Bld: 5.9 % — ABNORMAL HIGH (ref 4.8–5.6)

## 2019-08-15 LAB — TSH: TSH: 0.602 u[IU]/mL (ref 0.450–4.500)

## 2019-09-15 ENCOUNTER — Telehealth: Payer: Self-pay

## 2019-09-15 NOTE — Telephone Encounter (Signed)
Called and left VM reminding pt to complete her stool kit given to her a few weeks ago.   CM

## 2019-09-16 LAB — FECAL OCCULT BLOOD, IMMUNOCHEMICAL: Fecal Occult Bld: NEGATIVE

## 2019-12-24 ENCOUNTER — Other Ambulatory Visit: Payer: Self-pay | Admitting: Internal Medicine

## 2019-12-24 DIAGNOSIS — F39 Unspecified mood [affective] disorder: Secondary | ICD-10-CM

## 2020-03-25 ENCOUNTER — Ambulatory Visit
Admission: RE | Admit: 2020-03-25 | Discharge: 2020-03-25 | Disposition: A | Discharge status: Home / Self Care | Payer: 59 | Source: Ambulatory Visit | Attending: Internal Medicine | Admitting: Internal Medicine

## 2020-03-25 ENCOUNTER — Other Ambulatory Visit: Payer: Self-pay

## 2020-03-25 DIAGNOSIS — Z1231 Encounter for screening mammogram for malignant neoplasm of breast: Secondary | ICD-10-CM | POA: Diagnosis not present

## 2020-08-19 ENCOUNTER — Encounter: Payer: 59 | Admitting: Internal Medicine

## 2020-08-25 ENCOUNTER — Encounter: Payer: Self-pay | Admitting: Internal Medicine

## 2020-08-25 NOTE — Progress Notes (Signed)
Date:  08/26/2020   Name:  Taylor Serrano   DOB:  [birth year]   MRN:  [id number]   Chief Complaint: Annual Exam (Breast exam no pap ) Taylor Serrano is a 60 y.o. female who presents today for her Complete Annual Exam. She feels well. She reports exercising walking X2 days. She reports she is sleeping well. Breast complaints none.  Mammogram: 03/2020 DEXA: none Pap smear: declined by patient Colonoscopy: FIT last year negative Immunizations declined  There is no immunization history on file for this patient.   Hypertension This is a chronic problem. The problem is controlled. Pertinent negatives include no chest pain, headaches, palpitations or shortness of breath. Past treatments include ACE inhibitors. The current treatment provides significant improvement. There are no compliance problems.   Hyperlipidemia This is a chronic problem. The problem is controlled. Pertinent negatives include no chest pain or shortness of breath. Current antihyperlipidemic treatment includes statins. The current treatment provides significant improvement of lipids.  Depression        This is a chronic problem.  The problem has been resolved since onset.  Associated symptoms include no fatigue and no headaches.  Past treatments include SSRIs - Selective serotonin reuptake inhibitors.  Compliance with treatment is good.  Previous treatment provided significant relief. PCOS - with borderline elevated blood sugars.  On metformin and spironolactone.  She continues to decline pap and pelvic exams.  Lab Results  Component Value Date   CREATININE 0.88 08/14/2019   BUN 15 08/14/2019   NA 140 08/14/2019   K 4.8 08/14/2019   CL 101 08/14/2019   CO2 23 08/14/2019   Lab Results  Component Value Date   CHOL 145 08/14/2019   HDL 47 08/14/2019   LDLCALC 74 08/14/2019   TRIG 134 08/14/2019   CHOLHDL 3.1 08/14/2019   Lab Results  Component Value Date   TSH 0.602 08/14/2019   Lab Results  Component Value  Date   HGBA1C 5.9 (H) 08/14/2019   Lab Results  Component Value Date   WBC 7.4 08/14/2019   HGB 15.5 08/14/2019   HCT 45.3 08/14/2019   MCV 89 08/14/2019   PLT 408 08/14/2019   Lab Results  Component Value Date   ALT 23 08/14/2019   AST 22 08/14/2019   ALKPHOS 51 08/14/2019   BILITOT 0.6 08/14/2019     Review of Systems  Constitutional: Negative for chills, fatigue and fever.  HENT: Negative for congestion, hearing loss and trouble swallowing.   Eyes: Negative for visual disturbance.  Respiratory: Negative for cough, chest tightness, shortness of breath and wheezing.   Cardiovascular: Negative for chest pain, palpitations and leg swelling.  Gastrointestinal: Negative for abdominal pain, constipation, diarrhea and vomiting.  Endocrine: Negative for polydipsia and polyuria.  Genitourinary: Negative for dysuria, frequency, genital sores, hematuria, pelvic pain, vaginal bleeding and vaginal discharge.  Musculoskeletal: Negative for arthralgias, gait problem and joint swelling.  Skin: Negative for color change and rash.  Neurological: Negative for dizziness, tremors, light-headedness and headaches.  Hematological: Negative for adenopathy. Does not bruise/bleed easily.  Psychiatric/Behavioral: Positive for depression. Negative for dysphoric mood and sleep disturbance. The patient is not nervous/anxious.     Patient Active Problem List   Diagnosis Date Noted  . Mood disorder (HCC) 07/23/2018  . Abnormal mammogram of right breast 03/19/2016  . Dyslipidemia 02/15/2015  . Essential (primary) hypertension 02/15/2015  . Calcium blood increased 02/15/2015  . Blood glucose elevated 02/15/2015  . Bilateral polycystic ovarian syndrome 02/15/2015  .  Avitaminosis D 02/15/2015    Allergies  Allergen Reactions  . Penicillins     Other reaction(s): Hives  . Tetracyclines & Related     Other reaction(s): rash    Past Surgical History:  Procedure Laterality Date  . NO PAST  SURGERIES      Social History   Tobacco Use  . Smoking status: Never Smoker  . Smokeless tobacco: Never Used  Substance Use Topics  . Alcohol use: No    Alcohol/week: 0.0 standard drinks  . Drug use: No     Medication list has been reviewed and updated.  Current Meds  Medication Sig  . Ascorbic Acid (VITAMIN C) 1000 MG tablet Take 1,000 mg by mouth daily.  . Cholecalciferol (VITAMIN D) 2000 UNITS tablet Take by mouth.  Marland Kitchen CINNAMON PO Take by mouth daily.  . enalapril (VASOTEC) 2.5 MG tablet Take 1 tablet (2.5 mg total) by mouth daily.  Marland Kitchen escitalopram (LEXAPRO) 10 MG tablet TAKE ONE TABLET BY MOUTH ONCE DAILY  . magnesium 30 MG tablet Take 30 mg by mouth daily.  . metFORMIN (GLUCOPHAGE) 1000 MG tablet TAKE ONE TABLET BY MOUTH TWICE DAILY  . Multiple Vitamins-Minerals (ZINC PO) Take by mouth daily.  . simvastatin (ZOCOR) 20 MG tablet TAKE ONE TABLET BY MOUTH ONCE DAILY  . spironolactone (ALDACTONE) 50 MG tablet TAKE ONE TABLET BY MOUTH TWICE DAILY    PHQ 2/9 Scores 08/26/2020 08/14/2019 07/23/2018 07/17/2017  PHQ - 2 Score 0 0 0 2  PHQ- 9 Score 0 0 - 2    GAD 7 : Generalized Anxiety Score 08/26/2020  Nervous, Anxious, on Edge 0  Control/stop worrying 0  Worry too much - different things 0  Trouble relaxing 0  Restless 0  Easily annoyed or irritable 0  Afraid - awful might happen 0  Total GAD 7 Score 0    BP Readings from Last 3 Encounters:  08/26/20 138/82  08/14/19 120/66  07/23/18 126/64    Physical Exam Vitals and nursing note reviewed.  Constitutional:      General: She is not in acute distress.    Appearance: She is well-developed.  HENT:     Head: Normocephalic and atraumatic.     Right Ear: Tympanic membrane and ear canal normal.     Left Ear: Tympanic membrane and ear canal normal.     Nose:     Right Sinus: No maxillary sinus tenderness.     Left Sinus: No maxillary sinus tenderness.  Eyes:     General: No scleral icterus.       Right eye: No  discharge.        Left eye: No discharge.     Conjunctiva/sclera: Conjunctivae normal.  Neck:     Thyroid: No thyromegaly.     Vascular: No carotid bruit.  Cardiovascular:     Rate and Rhythm: Normal rate and regular rhythm.     Pulses: Normal pulses.     Heart sounds: Normal heart sounds.  Pulmonary:     Effort: Pulmonary effort is normal. No respiratory distress.     Breath sounds: No wheezing.  Chest:  Breasts:     Right: No mass, nipple discharge, skin change or tenderness.     Left: No mass, nipple discharge, skin change or tenderness.    Abdominal:     General: Bowel sounds are normal.     Palpations: Abdomen is soft. There is no mass.     Tenderness: There is no abdominal tenderness. There  is no guarding or rebound.  Musculoskeletal:     Cervical back: Normal range of motion. No erythema.     Right lower leg: No edema.     Left lower leg: No edema.  Lymphadenopathy:     Cervical: No cervical adenopathy.  Skin:    General: Skin is warm and dry.     Capillary Refill: Capillary refill takes less than 2 seconds.     Findings: No rash.  Neurological:     General: No focal deficit present.     Mental Status: She is alert and oriented to person, place, and time.     Cranial Nerves: No cranial nerve deficit.     Sensory: No sensory deficit.     Deep Tendon Reflexes: Reflexes are normal and symmetric.  Psychiatric:        Attention and Perception: Attention normal.        Mood and Affect: Mood normal.        Behavior: Behavior normal.     Wt Readings from Last 3 Encounters:  08/26/20 208 lb (94.3 kg)  08/14/19 200 lb (90.7 kg)  07/23/18 203 lb (92.1 kg)    BP 138/82   Pulse 72   Temp 98.2 F (36.8 C) (Oral)   Ht 5\' 3"  (1.6 m)   Wt 208 lb (94.3 kg)   LMP 04/05/2016   SpO2 96%   BMI 36.85 kg/m   Assessment and Plan: 1. Annual physical exam Exam is normal except for weight. Encourage regular exercise and appropriate dietary changes. Pt declines Pap and all  immunizations  2. Encounter for screening mammogram for breast cancer Schedule in October at [facility] - MM 3D SCREEN BREAST BILATERAL; Future  3. Essential (primary) hypertension Clinically stable exam with well controlled BP on ACEI. Tolerating medications without side effects at this time. Pt to continue current regimen and low sodium diet; benefits of regular exercise as able discussed. - CBC with Differential/Platelet - POCT urinalysis dipstick - enalapril (VASOTEC) 2.5 MG tablet; Take 1 tablet (2.5 mg total) by mouth daily.  Dispense: 90 tablet; Refill: 3  4. Bilateral polycystic ovarian syndrome Stable; continue current medications - Comprehensive metabolic panel - metFORMIN (GLUCOPHAGE) 1000 MG tablet; Take 1 tablet (1,000 mg total) by mouth 2 (two) times daily.  Dispense: 180 tablet; Refill: 3 - spironolactone (ALDACTONE) 50 MG tablet; Take 1 tablet (50 mg total) by mouth 2 (two) times daily.  Dispense: 180 tablet; Refill: 3  5. Blood glucose elevated On metformin - Comprehensive metabolic panel - Hemoglobin A1c  6. Dyslipidemia Tolerating statin medication without side effects at this time LDL is at goal of < 70 on current dose Continue same therapy without change at this time. - Lipid panel - simvastatin (ZOCOR) 20 MG tablet; Take 1 tablet (20 mg total) by mouth daily.  Dispense: 90 tablet; Refill: 3  7. Mood disorder (HCC) Clinically stable on current regimen with good control of symptoms, No SI or HI. Will continue current therapy. - TSH  8. Colon cancer screening - Fecal occult blood, imunochemical   Partially dictated using [place] urbano. Any errors are unintentional.  Animal nutritionist, MD [facility]  08/26/2020

## 2020-08-26 ENCOUNTER — Ambulatory Visit (INDEPENDENT_AMBULATORY_CARE_PROVIDER_SITE_OTHER): Payer: 59 | Admitting: Internal Medicine

## 2020-08-26 ENCOUNTER — Other Ambulatory Visit: Payer: Self-pay

## 2020-08-26 ENCOUNTER — Encounter: Payer: Self-pay | Admitting: Internal Medicine

## 2020-08-26 VITALS — BP 134/80 | HR 72 | Temp 98.2°F | Ht 63.0 in | Wt 208.0 lb

## 2020-08-26 DIAGNOSIS — Z0000 Encounter for general adult medical examination without abnormal findings: Secondary | ICD-10-CM | POA: Diagnosis not present

## 2020-08-26 DIAGNOSIS — Z1231 Encounter for screening mammogram for malignant neoplasm of breast: Secondary | ICD-10-CM

## 2020-08-26 DIAGNOSIS — E282 Polycystic ovarian syndrome: Secondary | ICD-10-CM | POA: Diagnosis not present

## 2020-08-26 DIAGNOSIS — Z1211 Encounter for screening for malignant neoplasm of colon: Secondary | ICD-10-CM | POA: Diagnosis not present

## 2020-08-26 DIAGNOSIS — I10 Essential (primary) hypertension: Secondary | ICD-10-CM

## 2020-08-26 DIAGNOSIS — E785 Hyperlipidemia, unspecified: Secondary | ICD-10-CM

## 2020-08-26 DIAGNOSIS — F39 Unspecified mood [affective] disorder: Secondary | ICD-10-CM

## 2020-08-26 DIAGNOSIS — R739 Hyperglycemia, unspecified: Secondary | ICD-10-CM

## 2020-08-26 LAB — POCT URINALYSIS DIPSTICK
Bilirubin, UA: NEGATIVE
Blood, UA: NEGATIVE
Glucose, UA: NEGATIVE
Ketones, UA: NEGATIVE
Leukocytes, UA: NEGATIVE
Nitrite, UA: NEGATIVE
Protein, UA: NEGATIVE
Spec Grav, UA: 1.01
Urobilinogen, UA: 0.2 U/dL
pH, UA: 7

## 2020-08-26 MED ORDER — ENALAPRIL MALEATE 2.5 MG PO TABS: 2.5000 mg | ORAL_TABLET | Freq: Every day | ORAL | 3 refills | Status: DC

## 2020-08-26 MED ORDER — SPIRONOLACTONE 50 MG PO TABS: 50.0000 mg | ORAL_TABLET | Freq: Two times a day (BID) | ORAL | 3 refills | Status: DC

## 2020-08-26 MED ORDER — SIMVASTATIN 20 MG PO TABS: 20.0000 mg | ORAL_TABLET | Freq: Every day | ORAL | 3 refills | Status: DC

## 2020-08-26 MED ORDER — METFORMIN HCL 1000 MG PO TABS
1000.0000 mg | ORAL_TABLET | Freq: Two times a day (BID) | ORAL | 3 refills | Status: DC
Start: 2020-08-26 — End: 2021-08-03

## 2020-08-27 LAB — CBC WITH DIFFERENTIAL/PLATELET
Basophils Absolute: 0.2 10*3/uL (ref 0.0–0.2)
Basos: 2 %
EOS (ABSOLUTE): 0.5 10*3/uL — ABNORMAL HIGH (ref 0.0–0.4)
Eos: 6 %
Hematocrit: 43.7 % (ref 34.0–46.6)
Hemoglobin: 15.2 g/dL (ref 11.1–15.9)
Immature Grans (Abs): 0.1 10*3/uL (ref 0.0–0.1)
Immature Granulocytes: 1 %
Lymphocytes Absolute: 2.8 10*3/uL (ref 0.7–3.1)
Lymphs: 31 %
MCH: 30.5 pg (ref 26.6–33.0)
MCHC: 34.8 g/dL (ref 31.5–35.7)
MCV: 88 fL (ref 79–97)
Monocytes Absolute: 0.9 10*3/uL (ref 0.1–0.9)
Monocytes: 10 %
Neutrophils Absolute: 4.6 10*3/uL (ref 1.4–7.0)
Neutrophils: 50 %
Platelets: 403 10*3/uL (ref 150–450)
RBC: 4.98 x10E6/uL (ref 3.77–5.28)
RDW: 12.1 % (ref 11.7–15.4)
WBC: 9 10*3/uL (ref 3.4–10.8)

## 2020-08-27 LAB — COMPREHENSIVE METABOLIC PANEL
ALT: 30 IU/L (ref 0–32)
AST: 28 IU/L (ref 0–40)
Albumin/Globulin Ratio: 2 (ref 1.2–2.2)
Albumin: 4.9 g/dL (ref 3.8–4.9)
Alkaline Phosphatase: 50 IU/L (ref 44–121)
BUN/Creatinine Ratio: 12 (ref 9–23)
BUN: 12 mg/dL (ref 6–24)
Bilirubin Total: 0.4 mg/dL (ref 0.0–1.2)
CO2: 24 mmol/L (ref 20–29)
Calcium: 10.5 mg/dL — ABNORMAL HIGH (ref 8.7–10.2)
Chloride: 93 mmol/L — ABNORMAL LOW (ref 96–106)
Creatinine, Ser: 1.01 mg/dL — ABNORMAL HIGH (ref 0.57–1.00)
Globulin, Total: 2.5 g/dL (ref 1.5–4.5)
Glucose: 124 mg/dL — ABNORMAL HIGH (ref 65–99)
Potassium: 5.4 mmol/L — ABNORMAL HIGH (ref 3.5–5.2)
Sodium: 142 mmol/L (ref 134–144)
Total Protein: 7.4 g/dL (ref 6.0–8.5)
eGFR: 65 mL/min/{1.73_m2} (ref >59)

## 2020-08-27 LAB — TSH: TSH: 0.907 u[IU]/mL (ref 0.450–4.500)

## 2020-08-27 LAB — LIPID PANEL
Chol/HDL Ratio: 3.7 ratio (ref 0.0–4.4)
Cholesterol, Total: 183 mg/dL (ref 100–199)
HDL: 49 mg/dL (ref >39)
LDL Chol Calc (NIH): 105 mg/dL — ABNORMAL HIGH (ref 0–99)
Triglycerides: 167 mg/dL — ABNORMAL HIGH (ref 0–149)
VLDL Cholesterol Cal: 29 mg/dL (ref 5–40)

## 2020-08-27 LAB — HEMOGLOBIN A1C
Est. average glucose Bld gHb Est-mCnc: 128 mg/dL
Hgb A1c MFr Bld: 6.1 % — ABNORMAL HIGH (ref 4.8–5.6)

## 2020-08-31 ENCOUNTER — Telehealth: Payer: Self-pay

## 2020-08-31 NOTE — Telephone Encounter (Signed)
Called and left patient a VM to remind her to complete her Fecal occult test.

## 2020-09-24 LAB — FECAL OCCULT BLOOD, IMMUNOCHEMICAL: Fecal Occult Bld: NEGATIVE

## 2020-12-28 ENCOUNTER — Other Ambulatory Visit: Payer: Self-pay | Admitting: Internal Medicine

## 2020-12-28 DIAGNOSIS — F39 Unspecified mood [affective] disorder: Secondary | ICD-10-CM

## 2020-12-28 NOTE — Telephone Encounter (Signed)
Requested medications are due for refill today.  yes  Requested medications are on the active medications list.  yes  Last refill. 12/24/2019  Future visit scheduled.   yes  Notes to clinic.  Prescription is expired.

## 2021-03-31 ENCOUNTER — Ambulatory Visit
Admission: RE | Admit: 2021-03-31 | Discharge: 2021-03-31 | Disposition: A | Discharge status: Home / Self Care | Payer: 59 | Source: Ambulatory Visit | Attending: Internal Medicine | Admitting: Internal Medicine

## 2021-03-31 ENCOUNTER — Other Ambulatory Visit: Payer: Self-pay

## 2021-03-31 DIAGNOSIS — Z1231 Encounter for screening mammogram for malignant neoplasm of breast: Secondary | ICD-10-CM

## 2021-07-03 ENCOUNTER — Other Ambulatory Visit: Payer: Self-pay | Admitting: Internal Medicine

## 2021-07-03 DIAGNOSIS — F39 Unspecified mood [affective] disorder: Secondary | ICD-10-CM

## 2021-07-03 NOTE — Telephone Encounter (Signed)
Requested medications are due for refill today.  yes  Requested medications are on the active medications list.  yes  Last refill. 12/29/2020  Future visit scheduled.   yes  Notes to clinic.  Pt is more than 3 months overdue for an office visit.    Requested Prescriptions  Pending Prescriptions Disp Refills   escitalopram (LEXAPRO) 10 MG tablet [Pharmacy Med Name: escitalopram 10 mg tablet] 30 tablet 2    Sig: TAKE ONE TABLET BY MOUTH ONCE DAILY     Psychiatry:  Antidepressants - SSRI Failed - 07/03/2021  5:17 PM      Failed - Valid encounter within last 6 months    Recent Outpatient Visits           10 months ago Annual physical exam   [facility] Glean Hess, MD   1 year ago Annual physical exam   [facility] Glean Hess, MD   2 years ago Annual physical exam   [facility] Glean Hess, MD   3 years ago Annual physical exam   [facility] Glean Hess, MD   5 years ago Annual physical exam   [facility] Glean Hess, MD       Future Appointments             In 2 months Army Melia Jesse Sans, MD [facility], [facility]

## 2021-07-31 ENCOUNTER — Other Ambulatory Visit: Payer: Self-pay

## 2021-07-31 ENCOUNTER — Other Ambulatory Visit: Payer: Self-pay | Admitting: Internal Medicine

## 2021-07-31 DIAGNOSIS — I10 Essential (primary) hypertension: Secondary | ICD-10-CM

## 2021-07-31 MED ORDER — ENALAPRIL MALEATE 2.5 MG PO TABS: 2.5000 mg | ORAL_TABLET | Freq: Every day | ORAL | 0 refills | Status: DC

## 2021-07-31 NOTE — Telephone Encounter (Signed)
Pt called in about enalapril (VASOTEC) 2.5 MG tablet , just prescribed today to go to, looks [facility]d [facility], but pt wants it to go [facility],  [facility] - [place], [place] - [address] Phone:  [phone]  Fax:  [phone]

## 2021-08-01 MED ORDER — ENALAPRIL MALEATE 2.5 MG PO TABS: 2.5000 mg | ORAL_TABLET | Freq: Every day | ORAL | 0 refills | Status: DC

## 2021-08-01 NOTE — Telephone Encounter (Signed)
Resending to another pharmacy as provider ordered on 07/31/21.  Requested Prescriptions  Pending Prescriptions Disp Refills   enalapril (VASOTEC) 2.5 MG tablet 90 tablet 0    Sig: Take 1 tablet (2.5 mg total) by mouth daily.     Cardiovascular:  ACE Inhibitors Failed - 08/01/2021 11:22 AM      Failed - Cr in normal range and within 180 days    Creatinine, Ser  Date Value Ref Range Status  08/26/2020 1.01 (H) 0.57 - 1.00 mg/dL Final         Failed - K in normal range and within 180 days    Potassium  Date Value Ref Range Status  08/26/2020 5.4 (H) 3.5 - 5.2 mmol/L Final         Failed - Valid encounter within last 6 months    Recent Outpatient Visits          11 months ago Annual physical exam   [facility] Reubin Milan, MD   1 year ago Annual physical exam   [facility] Reubin Milan, MD   3 years ago Annual physical exam   [facility] Reubin Milan, MD   4 years ago Annual physical exam   [facility] Reubin Milan, MD   5 years ago Annual physical exam   [facility] Reubin Milan, MD      Future Appointments            In 1 month Reubin Milan, MD [facility], [facility]           Passed - Patient is not pregnant      Passed - Last BP in normal range    BP Readings from Last 1 Encounters:  08/26/20 134/80

## 2021-08-03 ENCOUNTER — Other Ambulatory Visit: Payer: Self-pay | Admitting: Internal Medicine

## 2021-08-03 DIAGNOSIS — F39 Unspecified mood [affective] disorder: Secondary | ICD-10-CM

## 2021-08-03 DIAGNOSIS — E282 Polycystic ovarian syndrome: Secondary | ICD-10-CM

## 2021-08-03 DIAGNOSIS — E785 Hyperlipidemia, unspecified: Secondary | ICD-10-CM

## 2021-08-03 MED ORDER — SIMVASTATIN 20 MG PO TABS: 20.0000 mg | ORAL_TABLET | Freq: Every day | ORAL | 0 refills | Status: DC

## 2021-08-03 MED ORDER — SPIRONOLACTONE 50 MG PO TABS: 50.0000 mg | ORAL_TABLET | Freq: Two times a day (BID) | ORAL | 0 refills | Status: DC

## 2021-08-03 MED ORDER — ESCITALOPRAM OXALATE 10 MG PO TABS: 10.0000 mg | ORAL_TABLET | Freq: Every day | ORAL | 0 refills | Status: DC

## 2021-08-03 MED ORDER — METFORMIN HCL 1000 MG PO TABS: 1000.0000 mg | ORAL_TABLET | Freq: Two times a day (BID) | ORAL | 0 refills | Status: DC

## 2021-08-03 NOTE — Telephone Encounter (Signed)
Requested medication (s) are due for refill today: yes ? ?Requested medication (s) are on the active medication list: yes ? ?Last refill:  lexapro 07/04/21 #90/0, metformin and sprinolactone 08/26/20 #180/3 ? ?Future visit scheduled: yes ? ?Notes to clinic:  Unable to refill per protocol due to failed labs, no updated results. ? ? ?  ?Requested Prescriptions  ?Pending Prescriptions Disp Refills  ? escitalopram (LEXAPRO) 10 MG tablet 90 tablet 0  ?  Sig: Take 1 tablet (10 mg total) by mouth daily.  ?  ? Psychiatry:  Antidepressants - SSRI Failed - 08/03/2021  1:02 PM  ?  ?  Failed - Valid encounter within last 6 months  ?  Recent Outpatient Visits   ? ?      ? 11 months ago Annual physical exam  ? [facility] Glean Hess, MD  ? 1 year ago Annual physical exam  ? [facility] Glean Hess, MD  ? 3 years ago Annual physical exam  ? [facility] Glean Hess, MD  ? 4 years ago Annual physical exam  ? [facility] Glean Hess, MD  ? 5 years ago Annual physical exam  ? [facility] Glean Hess, MD  ? ?  ?  ?Future Appointments   ? ?        ? In 4 weeks Glean Hess, MD [facility]  ? ?  ? ?  ?  ?  ? metFORMIN (GLUCOPHAGE) 1000 MG tablet 180 tablet 3  ?  Sig: Take 1 tablet (1,000 mg total) by mouth 2 (two) times daily.  ?  ? Endocrinology:  Diabetes - Biguanides Failed - 08/03/2021  1:02 PM  ?  ?  Failed - Cr in normal range and within 360 days  ?  Creatinine, Ser  ?Date Value Ref Range Status  ?08/26/2020 1.01 (H) 0.57 - 1.00 mg/dL Final  ?  ?  ?  ?  Failed - HBA1C is between 0 and 7.9 and within 180 days  ?  Hgb A1c MFr Bld  ?Date Value Ref Range Status  ?08/26/2020 6.1 (H) 4.8 - 5.6 % Final  ?  Comment:  ?           Prediabetes: 5.7 - 6.4 ?         Diabetes: >6.4 ?         Glycemic control for adults with diabetes: <7.0 ?  ?  ?  ?  ?  Failed - B12 Level in normal range and within 720 days  ?  No results found for: VITAMINB12  ?  ?  ?   Failed - Valid encounter within last 6 months  ?  Recent Outpatient Visits   ? ?      ? 11 months ago Annual physical exam  ? [facility] Glean Hess, MD  ? 1 year ago Annual physical exam  ? [facility] Glean Hess, MD  ? 3 years ago Annual physical exam  ? [facility] Glean Hess, MD  ? 4 years ago Annual physical exam  ? [facility] Glean Hess, MD  ? 5 years ago Annual physical exam  ? [facility] Glean Hess, MD  ? ?  ?  ?Future Appointments   ? ?        ? In 4 weeks Glean Hess, MD [facility], [place]  ? ?  ? ?  ?  ?  Passed - eGFR in normal range and within 360 days  ?  GFR calc Af Amer  ?Date Value Ref Range Status  ?08/14/2019 85 >59 mL/min/1.73 Final  ? ?GFR calc non Af Amer  ?Date Value Ref Range Status  ?08/14/2019 74 >59 mL/min/1.73 Final  ? ?eGFR  ?Date Value Ref Range Status  ?08/26/2020 65 >59 mL/min/1.73 Final  ?  ?  ?  ?  Passed - CBC within normal limits and completed in the last 12 months  ?  WBC  ?Date Value Ref Range Status  ?08/26/2020 9.0 3.4 - 10.8 x10E3/uL Final  ? ?RBC  ?Date Value Ref Range Status  ?08/26/2020 4.98 3.77 - 5.28 x10E6/uL Final  ? ?Hemoglobin  ?Date Value Ref Range Status  ?08/26/2020 15.2 11.1 - 15.9 g/dL Final  ? ?Hematocrit  ?Date Value Ref Range Status  ?08/26/2020 43.7 34.0 - 46.6 % Final  ? ?MCHC  ?Date Value Ref Range Status  ?08/26/2020 34.8 31.5 - 35.7 g/dL Final  ? ?MCH  ?Date Value Ref Range Status  ?08/26/2020 30.5 26.6 - 33.0 pg Final  ? ?MCV  ?Date Value Ref Range Status  ?08/26/2020 88 79 - 97 fL Final  ? ?No results found for: PLTCOUNTKUC, LABPLAT, [place] ?RDW  ?Date Value Ref Range Status  ?08/26/2020 12.1 11.7 - 15.4 % Final  ? ?  ?  ?  ? spironolactone (ALDACTONE) 50 MG tablet 180 tablet 3  ?  Sig: Take 1 tablet (50 mg total) by mouth 2 (two) times daily.  ?  ? Cardiovascular: Diuretics - Aldosterone Antagonist Failed - 08/03/2021  1:02 PM  ?  ?  Failed - Cr in  normal range and within 180 days  ?  Creatinine, Ser  ?Date Value Ref Range Status  ?08/26/2020 1.01 (H) 0.57 - 1.00 mg/dL Final  ?  ?  ?  ?  Failed - K in normal range and within 180 days  ?  Potassium  ?Date Value Ref Range Status  ?08/26/2020 5.4 (H) 3.5 - 5.2 mmol/L Final  ?  ?  ?  ?  Failed - Na in normal range and within 180 days  ?  Sodium  ?Date Value Ref Range Status  ?08/26/2020 142 134 - 144 mmol/L Final  ?  ?  ?  ?  Failed - eGFR is 30 or above and within 180 days  ?  GFR calc Af Amer  ?Date Value Ref Range Status  ?08/14/2019 85 >59 mL/min/1.73 Final  ? ?GFR calc non Af Amer  ?Date Value Ref Range Status  ?08/14/2019 74 >59 mL/min/1.73 Final  ? ?eGFR  ?Date Value Ref Range Status  ?08/26/2020 65 >59 mL/min/1.73 Final  ?  ?  ?  ?  Failed - Valid encounter within last 6 months  ?  Recent Outpatient Visits   ? ?      ? 11 months ago Annual physical exam  ? [facility] Glean Hess, MD  ? 1 year ago Annual physical exam  ? [facility] Glean Hess, MD  ? 3 years ago Annual physical exam  ? [facility] Glean Hess, MD  ? 4 years ago Annual physical exam  ? [facility] Glean Hess, MD  ? 5 years ago Annual physical exam  ? [facility] Glean Hess, MD  ? ?  ?  ?Future Appointments   ? ?        ? In 4 weeks  Glean Hess, MD [facility]  ? ?  ? ?  ?  ?  Passed - Last BP in normal range  ?  BP Readings from Last 1 Encounters:  ?08/26/20 134/80  ?  ?  ?  ?  ?Signed Prescriptions Disp Refills  ? simvastatin (ZOCOR) 20 MG tablet 90 tablet 0  ?  Sig: Take 1 tablet (20 mg total) by mouth daily.  ?  ? Cardiovascular:  Antilipid - Statins Failed - 08/03/2021  1:02 PM  ?  ?  Failed - Lipid Panel in normal range within the last 12 months  ?  Cholesterol, Total  ?Date Value Ref Range Status  ?08/26/2020 183 100 - 199 mg/dL Final  ? ?LDL Chol Calc (NIH)  ?Date Value Ref Range Status  ?08/26/2020 105 (H) 0 - 99 mg/dL Final  ? ?HDL   ?Date Value Ref Range Status  ?08/26/2020 49 >39 mg/dL Final  ? ?Triglycerides  ?Date Value Ref Range Status  ?08/26/2020 167 (H) 0 - 149 mg/dL Final  ? ?  ?  ?  Passed - Patient is not pregnant  ?  ?  Passed - Valid encounter within last 12 months  ?  Recent Outpatient Visits   ? ?      ? 11 months ago Annual physical exam  ? [facility] Glean Hess, MD  ? 1 year ago Annual physical exam  ? [facility] Glean Hess, MD  ? 3 years ago Annual physical exam  ? [facility] Glean Hess, MD  ? 4 years ago Annual physical exam  ? [facility] Glean Hess, MD  ? 5 years ago Annual physical exam  ? [facility] Glean Hess, MD  ? ?  ?  ?Future Appointments   ? ?        ? In 4 weeks Glean Hess, MD [facility], [place]  ? ?  ? ?  ?  ?  ? ?

## 2021-08-03 NOTE — Telephone Encounter (Signed)
Medication Refill - Medication: spironolactone (ALDACTONE) 50 MG tablet ?simvastatin (ZOCOR) 20 MG tablet ?metFORMIN (GLUCOPHAGE) 1000 MG tablet ? ?Has the patient contacted their pharmacy? Yes.   [facility] is [facility]. ?(Preferred Pharmacy (with phone number or street name): [facility] - [place], [place] - [address] ? ?Has the patient been seen for an appointment in the last year OR does the patient have an upcoming appointment? Yes.   ? ?All pt's meds were at [facility] said [facility] (who got [place]'s pts) told them they do not have any record of the pt's meds. [facility] wants everything sent to them. Including escitalopram (LEXAPRO) 10 MG tablet.  The pharmacist said they do not know if pt got the full 90 day supply in Jan b/c there are no records. ?

## 2021-08-03 NOTE — Telephone Encounter (Signed)
Requested Prescriptions  ?Pending Prescriptions Disp Refills  ?? escitalopram (LEXAPRO) 10 MG tablet 90 tablet 0  ?  Sig: Take 1 tablet (10 mg total) by mouth daily.  ?  ? Psychiatry:  Antidepressants - SSRI Failed - 08/03/2021  1:02 PM  ?  ?  Failed - Valid encounter within last 6 months  ?  Recent Outpatient Visits   ?      ? 11 months ago Annual physical exam  ? [facility] Glean Hess, MD  ? 1 year ago Annual physical exam  ? [facility] Glean Hess, MD  ? 3 years ago Annual physical exam  ? [facility] Glean Hess, MD  ? 4 years ago Annual physical exam  ? [facility] Glean Hess, MD  ? 5 years ago Annual physical exam  ? [facility] Glean Hess, MD  ?  ?  ?Future Appointments   ?        ? In 4 weeks Glean Hess, MD [facility]  ?  ? ?  ?  ?  ?? metFORMIN (GLUCOPHAGE) 1000 MG tablet 180 tablet 3  ?  Sig: Take 1 tablet (1,000 mg total) by mouth 2 (two) times daily.  ?  ? Endocrinology:  Diabetes - Biguanides Failed - 08/03/2021  1:02 PM  ?  ?  Failed - Cr in normal range and within 360 days  ?  Creatinine, Ser  ?Date Value Ref Range Status  ?08/26/2020 1.01 (H) 0.57 - 1.00 mg/dL Final  ?   ?  ?  Failed - HBA1C is between 0 and 7.9 and within 180 days  ?  Hgb A1c MFr Bld  ?Date Value Ref Range Status  ?08/26/2020 6.1 (H) 4.8 - 5.6 % Final  ?  Comment:  ?           Prediabetes: 5.7 - 6.4 ?         Diabetes: >6.4 ?         Glycemic control for adults with diabetes: <7.0 ?  ?   ?  ?  Failed - B12 Level in normal range and within 720 days  ?  No results found for: VITAMINB12   ?  ?  Failed - Valid encounter within last 6 months  ?  Recent Outpatient Visits   ?      ? 11 months ago Annual physical exam  ? [facility] Glean Hess, MD  ? 1 year ago Annual physical exam  ? [facility] Glean Hess, MD  ? 3 years ago Annual physical exam  ? [facility] Glean Hess, MD  ? 4 years  ago Annual physical exam  ? [facility] Glean Hess, MD  ? 5 years ago Annual physical exam  ? [facility] Glean Hess, MD  ?  ?  ?Future Appointments   ?        ? In 4 weeks Glean Hess, MD [facility], [facility]  ?  ? ?  ?  ?  Passed - eGFR in normal range and within 360 days  ?  GFR calc Af Amer  ?Date Value Ref Range Status  ?08/14/2019 85 >59 mL/min/1.73 Final  ? ?GFR calc non Af Amer  ?Date Value Ref Range Status  ?08/14/2019 74 >59 mL/min/1.73 Final  ? ?eGFR  ?Date Value Ref Range Status  ?08/26/2020 65 >59  mL/min/1.73 Final  ?   ?  ?  Passed - CBC within normal limits and completed in the last 12 months  ?  WBC  ?Date Value Ref Range Status  ?08/26/2020 9.0 3.4 - 10.8 x10E3/uL Final  ? ?RBC  ?Date Value Ref Range Status  ?08/26/2020 4.98 3.77 - 5.28 x10E6/uL Final  ? ?Hemoglobin  ?Date Value Ref Range Status  ?08/26/2020 15.2 11.1 - 15.9 g/dL Final  ? ?Hematocrit  ?Date Value Ref Range Status  ?08/26/2020 43.7 34.0 - 46.6 % Final  ? ?MCHC  ?Date Value Ref Range Status  ?08/26/2020 34.8 31.5 - 35.7 g/dL Final  ? ?MCH  ?Date Value Ref Range Status  ?08/26/2020 30.5 26.6 - 33.0 pg Final  ? ?MCV  ?Date Value Ref Range Status  ?08/26/2020 88 79 - 97 fL Final  ? ?No results found for: PLTCOUNTKUC, LABPLAT, POCPLA ?RDW  ?Date Value Ref Range Status  ?08/26/2020 12.1 11.7 - 15.4 % Final  ? ?  ?  ?  ?? simvastatin (ZOCOR) 20 MG tablet 90 tablet 0  ?  Sig: Take 1 tablet (20 mg total) by mouth daily.  ?  ? Cardiovascular:  Antilipid - Statins Failed - 08/03/2021  1:02 PM  ?  ?  Failed - Lipid Panel in normal range within the last 12 months  ?  Cholesterol, Total  ?Date Value Ref Range Status  ?08/26/2020 183 100 - 199 mg/dL Final  ? ?LDL Chol Calc (NIH)  ?Date Value Ref Range Status  ?08/26/2020 105 (H) 0 - 99 mg/dL Final  ? ?HDL  ?Date Value Ref Range Status  ?08/26/2020 49 >39 mg/dL Final  ? ?Triglycerides  ?Date Value Ref Range Status  ?08/26/2020 167 (H) 0 - 149 mg/dL Final   ? ?  ?  ?  Passed - Patient is not pregnant  ?  ?  Passed - Valid encounter within last 12 months  ?  Recent Outpatient Visits   ?      ? 11 months ago Annual physical exam  ? [facility] Berglund, Laura H, MD  ? 1 year ago Annual physical exam  ? [facility] Berglund, Laura H, MD  ? 3 years ago Annual physical exam  ? [facility] Berglund, Laura H, MD  ? 4 years ago Annual physical exam  ? [facility] Berglund, Laura H, MD  ? 5 years ago Annual physical exam  ? [facility] Berglund, Laura H, MD  ?  ?  ?Future Appointments   ?        ? In 4 weeks Berglund, Laura H, MD [facility], [facility]  ?  ? ?  ?  ?  ?? spironolactone (ALDACTONE) 50 MG tablet 180 tablet 3  ?  Sig: Take 1 tablet (50 mg total) by mouth 2 (two) times daily.  ?  ? Cardiovascular: Diuretics - Aldosterone Antagonist Failed - 08/03/2021  1:02 PM  ?  ?  Failed - Cr in normal range and within 180 days  ?  Creatinine, Ser  ?Date Value Ref Range Status  ?08/26/2020 1.01 (H) 0.57 - 1.00 mg/dL Final  ?   ?  ?  Failed - K in normal range and within 180 days  ?  Potassium  ?Date Value Ref Range Status  ?08/26/2020 5.4 (H) 3.5 - 5.2 mmol/L Final  ?   ?  ?  Failed - Na in normal range and within 180 days  ?    Sodium  ?Date Value Ref Range Status  ?08/26/2020 142 134 - 144 mmol/L Final  ?   ?  ?  Failed - eGFR is 30 or above and within 180 days  ?  GFR calc Af Amer  ?Date Value Ref Range Status  ?08/14/2019 85 >59 mL/min/1.73 Final  ? ?GFR calc non Af Amer  ?Date Value Ref Range Status  ?08/14/2019 74 >59 mL/min/1.73 Final  ? ?eGFR  ?Date Value Ref Range Status  ?08/26/2020 65 >59 mL/min/1.73 Final  ?   ?  ?  Failed - Valid encounter within last 6 months  ?  Recent Outpatient Visits   ?      ? 11 months ago Annual physical exam  ? [facility] Glean Hess, MD  ? 1 year ago Annual physical exam  ? [facility] Glean Hess, MD  ? 3 years ago Annual physical exam  ? [facility] Glean Hess, MD  ? 4 years ago Annual physical exam  ? [facility] Glean Hess, MD  ? 5 years ago Annual physical exam  ? [facility] Glean Hess, MD  ?  ?  ?Future Appointments   ?        ? In 4 weeks Glean Hess, MD [facility], [facility]  ?  ? ?  ?  ?  Passed - Last BP in normal range  ?  BP Readings from Last 1 Encounters:  ?08/26/20 134/80  ?   ?  ?  ? ? ?

## 2021-09-01 ENCOUNTER — Ambulatory Visit (INDEPENDENT_AMBULATORY_CARE_PROVIDER_SITE_OTHER): Payer: 59 | Admitting: Internal Medicine

## 2021-09-01 ENCOUNTER — Encounter: Payer: Self-pay | Admitting: Internal Medicine

## 2021-09-01 VITALS — BP 132/80 | HR 76 | Ht 63.0 in | Wt 199.8 lb

## 2021-09-01 DIAGNOSIS — Z0000 Encounter for general adult medical examination without abnormal findings: Secondary | ICD-10-CM | POA: Diagnosis not present

## 2021-09-01 DIAGNOSIS — Z1231 Encounter for screening mammogram for malignant neoplasm of breast: Secondary | ICD-10-CM | POA: Diagnosis not present

## 2021-09-01 DIAGNOSIS — E785 Hyperlipidemia, unspecified: Secondary | ICD-10-CM

## 2021-09-01 DIAGNOSIS — R7303 Prediabetes: Secondary | ICD-10-CM | POA: Diagnosis not present

## 2021-09-01 DIAGNOSIS — F39 Unspecified mood [affective] disorder: Secondary | ICD-10-CM

## 2021-09-01 DIAGNOSIS — R69 Illness, unspecified: Secondary | ICD-10-CM | POA: Diagnosis not present

## 2021-09-01 DIAGNOSIS — Z1211 Encounter for screening for malignant neoplasm of colon: Secondary | ICD-10-CM | POA: Diagnosis not present

## 2021-09-01 DIAGNOSIS — I10 Essential (primary) hypertension: Secondary | ICD-10-CM

## 2021-09-01 LAB — POCT URINALYSIS DIPSTICK
Bilirubin, UA: NEGATIVE
Blood, UA: NEGATIVE
Glucose, UA: NEGATIVE
Ketones, UA: NEGATIVE
Leukocytes, UA: NEGATIVE
Nitrite, UA: NEGATIVE
Protein, UA: NEGATIVE
Spec Grav, UA: 1.01 (ref 1.010–1.025)
Urobilinogen, UA: 0.2 E.U./dL
pH, UA: 6.5 (ref 5.0–8.0)

## 2021-09-01 NOTE — Progress Notes (Signed)
? ? ?Date:  09/01/2021  ? ?Name:  Taylor Serrano   DOB:  1960/06/27   MRN:  [id number] ? ? ?Chief Complaint: Annual Exam (Breast Exam. No pap. UA, and Fit test.) ?Taylor Serrano is a 61 y.o. female who presents today for her Complete Annual Exam. She feels well. She reports exercising- some. She reports she is sleeping well. Breast complaints - none. ? ?Mammogram: 03/2021 ?DEXA: none ?Pap smear: discontinued ?Colonoscopy: FIT 09/2020 ? ?There are no preventive care reminders to display for this patient. ?  ? ?There is no immunization history on file for this patient. ? ?Hypertension ?This is a chronic problem. The problem is controlled. Pertinent negatives include no chest pain, headaches, palpitations or shortness of breath. Past treatments include ACE inhibitors and diuretics.  ?Hyperlipidemia ?This is a chronic problem. The problem is controlled. Pertinent negatives include no chest pain or shortness of breath. Current antihyperlipidemic treatment includes statins. The current treatment provides significant improvement of lipids.  ?Depression ?       This is a chronic problem.The problem is unchanged.  Associated symptoms include no fatigue and no headaches.  Past treatments include SSRIs - Selective serotonin reuptake inhibitors.  Compliance with treatment is good. ?Diabetes ?She presents for her follow-up diabetic visit. Diabetes type: prediabetes. Her disease course has been stable. Pertinent negatives for hypoglycemia include no dizziness, headaches, nervousness/anxiousness or tremors. Pertinent negatives for diabetes include no chest pain, no fatigue, no polydipsia and no polyuria. Current diabetic treatment includes oral agent (monotherapy) (metformin started for PCOS).  ? ?Lab Results  ?Component Value Date  ? NA 142 08/26/2020  ? K 5.4 (H) 08/26/2020  ? CO2 24 08/26/2020  ? GLUCOSE 124 (H) 08/26/2020  ? BUN 12 08/26/2020  ? CREATININE 1.01 (H) 08/26/2020  ? CALCIUM 10.5 (H) 08/26/2020  ? EGFR 65 08/26/2020   ? GFRNONAA 74 08/14/2019  ? ?Lab Results  ?Component Value Date  ? CHOL 183 08/26/2020  ? HDL 49 08/26/2020  ? LDLCALC 105 (H) 08/26/2020  ? TRIG 167 (H) 08/26/2020  ? CHOLHDL 3.7 08/26/2020  ? ?Lab Results  ?Component Value Date  ? TSH 0.907 08/26/2020  ? ?Lab Results  ?Component Value Date  ? HGBA1C 6.1 (H) 08/26/2020  ? ?Lab Results  ?Component Value Date  ? WBC 9.0 08/26/2020  ? HGB 15.2 08/26/2020  ? HCT 43.7 08/26/2020  ? MCV 88 08/26/2020  ? PLT 403 08/26/2020  ? ?Lab Results  ?Component Value Date  ? ALT 30 08/26/2020  ? AST 28 08/26/2020  ? ALKPHOS 50 08/26/2020  ? BILITOT 0.4 08/26/2020  ? ?Lab Results  ?Component Value Date  ? VD25OH 89.8 07/04/2016  ?  ? ?Review of Systems  ?Constitutional:  Negative for chills, fatigue and fever.  ?HENT:  Negative for congestion, hearing loss, tinnitus, trouble swallowing and voice change.   ?Eyes:  Negative for visual disturbance.  ?Respiratory:  Negative for cough, chest tightness, shortness of breath and wheezing.   ?Cardiovascular:  Negative for chest pain, palpitations and leg swelling.  ?Gastrointestinal:  Negative for abdominal pain, constipation, diarrhea and vomiting.  ?Endocrine: Negative for polydipsia and polyuria.  ?Genitourinary:  Negative for dysuria, frequency, genital sores, vaginal bleeding and vaginal discharge.  ?Musculoskeletal:  Negative for arthralgias, gait problem and joint swelling.  ?Skin:  Negative for color change and rash.  ?Neurological:  Negative for dizziness, tremors, light-headedness and headaches.  ?Hematological:  Negative for adenopathy. Does not bruise/bleed easily.  ?Psychiatric/Behavioral:  Positive for depression.  Negative for dysphoric mood and sleep disturbance. The patient is not nervous/anxious.   ? ?Patient Active Problem List  ? Diagnosis Date Noted  ? Mood disorder ([place]) 07/23/2018  ? Abnormal mammogram of right breast 03/19/2016  ? Dyslipidemia 02/15/2015  ? Essential (primary) hypertension 02/15/2015  ? Calcium blood  increased 02/15/2015  ? Prediabetes 02/15/2015  ? 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 Avitaminosis D 02/15/2015  ? ? ?Allergies  ?Allergen Reactions  ? Penicillins   ?  Other reaction(s): Hives  ? Tetracyclines & Related   ?  Other reaction(s): rash  ? ? ?Past Surgical History:  ?Procedure Laterality Date  ? NO PAST SURGERIES    ? ? ?Social History  ? ?Tobacco Use  ? Smoking status: Never  ? Smokeless tobacco: Never  ?Substance Use Topics  ? Alcohol use: No  ?  Alcohol/week: 0.0 standard drinks  ? Drug use: No  ? ? ? ?Medication list has been reviewed and updated. ? ?Current Meds  ?Medication Sig  ? Ascorbic Acid (VITAMIN C) 1000 MG tablet Take 1,000 mg by mouth daily.  ? Cholecalciferol (VITAMIN D) 2000 UNITS tablet Take by mouth.  ? CINNAMON PO Take by mouth daily.  ? enalapril (VASOTEC) 2.5 MG tablet Take 1 tablet (2.5 mg total) by mouth daily.  ? escitalopram (LEXAPRO) 10 MG tablet Take 1 tablet (10 mg total) by mouth daily.  ? magnesium 30 MG tablet Take 30 mg by mouth daily.  ? metFORMIN (GLUCOPHAGE) 1000 MG tablet Take 1 tablet (1,000 mg total) by mouth 2 (two) times daily.  ? Multiple Vitamins-Minerals (ZINC PO) Take by mouth daily.  ? simvastatin (ZOCOR) 20 MG tablet Take 1 tablet (20 mg total) by mouth daily.  ? spironolactone (ALDACTONE) 50 MG tablet Take 1 tablet (50 mg total) by mouth 2 (two) times daily.  ? ? ? ?  09/01/2021  ?  9:18 AM 08/26/2020  ?  9:11 AM  ?GAD 7 : Generalized Anxiety Score  ?Nervous, Anxious, on Edge 0 0  ?Control/stop worrying 0 0  ?Worry too much - different things 0 0  ?Trouble relaxing 0 0  ?Restless 0 0  ?Easily annoyed or irritable 0 0  ?Afraid - awful might happen 0 0  ?Total GAD 7 Score 0 0  ?Anxiety Difficulty Not difficult at all   ? ? ? ?  09/01/2021  ?  9:18 AM  ?Depression screen PHQ 2/9  ?Decreased Interest 0  ?Down, Depressed, Hopeless 0  ?PHQ - 2 Score 0  ?Altered sleeping 1  ?Tired, decreased energy 0  ?Change in appetite 0  ?Feeling bad or failure  about yourself  0  ?Trouble concentrating 0  ?Moving slowly or fidgety/restless 0  ?Suicidal thoughts 0  ?PHQ-9 Score 1  ?Difficult doing work/chores Not difficult at all  ? ? ?BP Readings from Last 3 Encounters:  ?09/01/21 132/80  ?08/26/20 134/80  ?08/14/19 120/66  ? ? ?Physical Exam ?Vitals and nursing note reviewed.  ?Constitutional:   ?   General: She is not in acute distress. ?   Appearance: She is well-developed.  ?HENT:  ?   Head: Normocephalic and atraumatic.  ?   Right Ear: Tympanic membrane and ear canal normal.  ?   Left Ear: Tympanic membrane and ear canal normal.  ?   Nose:  ?   Right Sinus: No maxillary sinus tenderness.  ?   Left Sinus: No maxillary sinus tenderness.  ?Eyes:  ?   General: No scleral  icterus.    ?   Right eye: No discharge.     ?   Left eye: No discharge.  ?   Conjunctiva/sclera: Conjunctivae normal.  ?Neck:  ?   Thyroid: No thyromegaly.  ?   Vascular: No carotid bruit.  ?Cardiovascular:  ?   Rate and Rhythm: Normal rate and regular rhythm.  ?   Pulses: Normal pulses.  ?   Heart sounds: Normal heart sounds.  ?Pulmonary:  ?   Effort: Pulmonary effort is normal. No respiratory distress.  ?   Breath sounds: No wheezing.  ?Chest:  ?Breasts: ?   Right: No mass, nipple discharge, skin change or tenderness.  ?   Left: No mass, nipple discharge, skin change or tenderness.  ?Abdominal:  ?   General: Bowel sounds are normal.  ?   Palpations: Abdomen is soft.  ?   Tenderness: There is no abdominal tenderness.  ?Musculoskeletal:  ?   Cervical back: Normal range of motion. No erythema.  ?   Right lower leg: No edema.  ?   Left lower leg: No edema.  ?Lymphadenopathy:  ?   Cervical: No cervical adenopathy.  ?Skin: ?   General: Skin is warm and dry.  ?   Findings: No rash.  ?Neurological:  ?   Mental Status: She is alert and oriented to person, place, and time.  ?   Cranial Nerves: No cranial nerve deficit.  ?   Sensory: No sensory deficit.  ?   Deep Tendon Reflexes: Reflexes are normal and  symmetric.  ?Psychiatric:     ?   Attention and Perception: Attention normal.     ?   Mood and Affect: Mood normal.  ? ? ?Wt Readings from Last 3 Encounters:  ?09/01/21 199 lb 12.8 oz (90.6 kg)  ?08/26/20 208 lb

## 2021-09-02 LAB — COMPREHENSIVE METABOLIC PANEL
ALT: 18 IU/L (ref 0–32)
AST: 21 IU/L (ref 0–40)
Albumin/Globulin Ratio: 2 (ref 1.2–2.2)
Albumin: 5 g/dL — ABNORMAL HIGH (ref 3.8–4.9)
Alkaline Phosphatase: 44 IU/L (ref 44–121)
BUN/Creatinine Ratio: 14 (ref 9–23)
BUN: 14 mg/dL (ref 6–24)
Bilirubin Total: 0.5 mg/dL (ref 0.0–1.2)
CO2: 25 mmol/L (ref 20–29)
Calcium: 10.3 mg/dL — ABNORMAL HIGH (ref 8.7–10.2)
Chloride: 100 mmol/L (ref 96–106)
Creatinine, Ser: 0.98 mg/dL (ref 0.57–1.00)
Globulin, Total: 2.5 g/dL (ref 1.5–4.5)
Glucose: 103 mg/dL — ABNORMAL HIGH (ref 70–99)
Potassium: 4.8 mmol/L (ref 3.5–5.2)
Sodium: 140 mmol/L (ref 134–144)
Total Protein: 7.5 g/dL (ref 6.0–8.5)
eGFR: 67 mL/min/{1.73_m2} (ref >59)

## 2021-09-02 LAB — LIPID PANEL
Chol/HDL Ratio: 3.8 ratio (ref 0.0–4.4)
Cholesterol, Total: 184 mg/dL (ref 100–199)
HDL: 48 mg/dL (ref >39)
LDL Chol Calc (NIH): 113 mg/dL — ABNORMAL HIGH (ref 0–99)
Triglycerides: 127 mg/dL (ref 0–149)
VLDL Cholesterol Cal: 23 mg/dL (ref 5–40)

## 2021-09-02 LAB — CBC WITH DIFFERENTIAL/PLATELET
Basophils Absolute: 0.1 10*3/uL (ref 0.0–0.2)
Basos: 2 %
EOS (ABSOLUTE): 0.4 10*3/uL (ref 0.0–0.4)
Eos: 5 %
Hematocrit: 44.3 % (ref 34.0–46.6)
Hemoglobin: 15 g/dL (ref 11.1–15.9)
Immature Grans (Abs): 0 10*3/uL (ref 0.0–0.1)
Immature Granulocytes: 0 %
Lymphocytes Absolute: 2.3 10*3/uL (ref 0.7–3.1)
Lymphs: 33 %
MCH: 29.7 pg (ref 26.6–33.0)
MCHC: 33.9 g/dL (ref 31.5–35.7)
MCV: 88 fL (ref 79–97)
Monocytes Absolute: 0.6 10*3/uL (ref 0.1–0.9)
Monocytes: 9 %
Neutrophils Absolute: 3.6 10*3/uL (ref 1.4–7.0)
Neutrophils: 51 %
Platelets: 399 10*3/uL (ref 150–450)
RBC: 5.05 x10E6/uL (ref 3.77–5.28)
RDW: 11.9 % (ref 11.7–15.4)
WBC: 7.1 10*3/uL (ref 3.4–10.8)

## 2021-09-02 LAB — HEMOGLOBIN A1C
Est. average glucose Bld gHb Est-mCnc: 123 mg/dL
Hgb A1c MFr Bld: 5.9 % — ABNORMAL HIGH (ref 4.8–5.6)

## 2021-09-02 LAB — TSH: TSH: 0.697 u[IU]/mL (ref 0.450–4.500)

## 2021-09-18 DIAGNOSIS — Z1211 Encounter for screening for malignant neoplasm of colon: Secondary | ICD-10-CM | POA: Diagnosis not present

## 2021-09-20 LAB — FECAL OCCULT BLOOD, IMMUNOCHEMICAL: Fecal Occult Bld: NEGATIVE

## 2021-11-01 ENCOUNTER — Other Ambulatory Visit: Payer: Self-pay | Admitting: Internal Medicine

## 2021-11-01 DIAGNOSIS — I10 Essential (primary) hypertension: Secondary | ICD-10-CM

## 2021-11-02 NOTE — Telephone Encounter (Signed)
Requested Prescriptions  Pending Prescriptions Disp Refills  . enalapril (VASOTEC) 2.5 MG tablet [Pharmacy Med Name: ENALAPRIL TAB 2.5MG ] 90 tablet 0    Sig: TAKE 1 TABLET DAILY     Cardiovascular:  ACE Inhibitors Passed - 11/01/2021  9:50 PM      Passed - Cr in normal range and within 180 days    Creatinine, Ser  Date Value Ref Range Status  09/01/2021 0.98 0.57 - 1.00 mg/dL Final         Passed - K in normal range and within 180 days    Potassium  Date Value Ref Range Status  09/01/2021 4.8 3.5 - 5.2 mmol/L Final         Passed - Patient is not pregnant      Passed - Last BP in normal range    BP Readings from Last 1 Encounters:  09/01/21 132/80         Passed - Valid encounter within last 6 months    Recent Outpatient Visits          2 months ago Annual physical exam   [facility] Reubin Milan, MD   1 year ago Annual physical exam   [facility] Reubin Milan, MD   2 years ago Annual physical exam   [facility] Reubin Milan, MD   3 years ago Annual physical exam   [facility] Reubin Milan, MD   4 years ago Annual physical exam   [facility] Reubin Milan, MD      Future Appointments            In 10 months Judithann Graves Nyoka Cowden, MD [facility], [facility]

## 2021-11-28 ENCOUNTER — Other Ambulatory Visit: Payer: Self-pay | Admitting: Internal Medicine

## 2021-11-28 DIAGNOSIS — E282 Polycystic ovarian syndrome: Secondary | ICD-10-CM

## 2021-12-27 ENCOUNTER — Other Ambulatory Visit: Payer: Self-pay | Admitting: Internal Medicine

## 2021-12-27 DIAGNOSIS — E785 Hyperlipidemia, unspecified: Secondary | ICD-10-CM

## 2021-12-27 DIAGNOSIS — F39 Unspecified mood [affective] disorder: Secondary | ICD-10-CM

## 2021-12-28 NOTE — Telephone Encounter (Signed)
Requested Prescriptions  Pending Prescriptions Disp Refills  . escitalopram (LEXAPRO) 10 MG tablet [Pharmacy Med Name: ESCITALOPRAM OXALATE 10 MG TAB] 90 tablet 2    Sig: TAKE 1 TABLET BY MOUTH DAILY     Psychiatry:  Antidepressants - SSRI Passed - 12/27/2021  9:37 AM      Passed - Valid encounter within last 6 months    Recent Outpatient Visits          3 months ago Annual physical exam   [facility] Reubin Milan, MD   1 year ago Annual physical exam   [facility] Reubin Milan, MD   2 years ago Annual physical exam   [facility] Reubin Milan, MD   3 years ago Annual physical exam   [facility] Reubin Milan, MD   4 years ago Annual physical exam   [facility] Reubin Milan, MD      Future Appointments            In 8 months Judithann Graves Nyoka Cowden, MD [facility]           . simvastatin (ZOCOR) 20 MG tablet [Pharmacy Med Name: SIMVASTATIN 20 MG TAB] 90 tablet 0    Sig: TAKE ONE TABLET (20 MG) BY MOUTH EVERY DAY     Cardiovascular:  Antilipid - Statins Failed - 12/27/2021  9:37 AM      Failed - Lipid Panel in normal range within the last 12 months    Cholesterol, Total  Date Value Ref Range Status  09/01/2021 184 100 - 199 mg/dL Final   LDL Chol Calc (NIH)  Date Value Ref Range Status  09/01/2021 113 (H) 0 - 99 mg/dL Final   HDL  Date Value Ref Range Status  09/01/2021 48 >39 mg/dL Final   Triglycerides  Date Value Ref Range Status  09/01/2021 127 0 - 149 mg/dL Final         Passed - Patient is not pregnant      Passed - Valid encounter within last 12 months    Recent Outpatient Visits          3 months ago Annual physical exam   [facility] Reubin Milan, MD   1 year ago Annual physical exam   [facility] Reubin Milan, MD   2 years ago Annual physical exam   [facility] Reubin Milan, MD   3 years ago Annual physical exam    [facility] Reubin Milan, MD   4 years ago Annual physical exam   [facility] Reubin Milan, MD      Future Appointments            In 8 months Judithann Graves Nyoka Cowden, MD [facility], [facility]

## 2022-01-16 ENCOUNTER — Other Ambulatory Visit: Payer: Self-pay | Admitting: Internal Medicine

## 2022-01-16 DIAGNOSIS — I10 Essential (primary) hypertension: Secondary | ICD-10-CM

## 2022-01-16 MED ORDER — ENALAPRIL MALEATE 2.5 MG PO TABS: 2.5000 mg | ORAL_TABLET | Freq: Every day | ORAL | 0 refills | Status: DC

## 2022-01-16 NOTE — Telephone Encounter (Signed)
Requested Prescriptions  Pending Prescriptions Disp Refills  . enalapril (VASOTEC) 2.5 MG tablet 60 tablet 0    Sig: Take 1 tablet (2.5 mg total) by mouth daily.     Cardiovascular:  ACE Inhibitors Passed - 01/16/2022  1:52 PM      Passed - Cr in normal range and within 180 days    Creatinine, Ser  Date Value Ref Range Status  09/01/2021 0.98 0.57 - 1.00 mg/dL Final         Passed - K in normal range and within 180 days    Potassium  Date Value Ref Range Status  09/01/2021 4.8 3.5 - 5.2 mmol/L Final         Passed - Patient is not pregnant      Passed - Last BP in normal range    BP Readings from Last 1 Encounters:  09/01/21 132/80         Passed - Valid encounter within last 6 months    Recent Outpatient Visits          4 months ago Annual physical exam   [facility] Reubin Milan, MD   1 year ago Annual physical exam   [facility] Reubin Milan, MD   2 years ago Annual physical exam   [facility] Reubin Milan, MD   3 years ago Annual physical exam   [facility] Reubin Milan, MD   4 years ago Annual physical exam   [facility] Reubin Milan, MD      Future Appointments            In 7 months Judithann Graves Nyoka Cowden, MD [facility], [facility]

## 2022-01-16 NOTE — Telephone Encounter (Signed)
Medication Refill - Medication: enalapril (VASOTEC) 2.5 MG tablet   Has the patient contacted their pharmacy? No. Pt request Rx to go to local pharmacy  Preferred Pharmacy (with phone number or street name):  [facility] - [place], [place] - [address] Phone:  [phone]  Fax:  [phone]     Has the patient been seen for an appointment in the last year OR does the patient have an upcoming appointment? Yes.    Agent: Please be advised that RX refills may take up to 3 business days. We ask that you follow-up with your pharmacy.

## 2022-02-15 ENCOUNTER — Other Ambulatory Visit: Payer: Self-pay | Admitting: Internal Medicine

## 2022-02-15 DIAGNOSIS — E282 Polycystic ovarian syndrome: Secondary | ICD-10-CM

## 2022-02-16 NOTE — Telephone Encounter (Signed)
Requested Prescriptions  Pending Prescriptions Disp Refills  . spironolactone (ALDACTONE) 50 MG tablet [Pharmacy Med Name: SPIRONOLACTONE 50 MG TAB] 180 tablet 2    Sig: TAKE 1 TABLET BY MOUTH 2 TIMES DAILY     Cardiovascular: Diuretics - Aldosterone Antagonist Passed - 02/15/2022  1:05 PM      Passed - Cr in normal range and within 180 days    Creatinine, Ser  Date Value Ref Range Status  09/01/2021 0.98 0.57 - 1.00 mg/dL Final         Passed - K in normal range and within 180 days    Potassium  Date Value Ref Range Status  09/01/2021 4.8 3.5 - 5.2 mmol/L Final         Passed - Na in normal range and within 180 days    Sodium  Date Value Ref Range Status  09/01/2021 140 134 - 144 mmol/L Final         Passed - eGFR is 30 or above and within 180 days    GFR calc Af Amer  Date Value Ref Range Status  08/14/2019 85 >59 mL/min/1.73 Final   GFR calc non Af Amer  Date Value Ref Range Status  08/14/2019 74 >59 mL/min/1.73 Final   eGFR  Date Value Ref Range Status  09/01/2021 67 >59 mL/min/1.73 Final         Passed - Last BP in normal range    BP Readings from Last 1 Encounters:  09/01/21 132/80         Passed - Valid encounter within last 6 months    Recent Outpatient Visits          5 months ago Annual physical exam   [place] [facility], Jesse Sans, MD   1 year ago Annual physical exam   [place] [facility] at [facility], Jesse Sans, MD   2 years ago Annual physical exam   [facility] Care and [facility] Medicine at [facility], Jesse Sans, MD   3 years ago Annual physical exam   [place] [facility] at [facility], Jesse Sans, MD   4 years ago Annual physical exam   [place] [facility] at [facility], Jesse Sans, MD      Future Appointments            In 6 months Army Melia Jesse Sans, MD [facility]  Health Primary Care and Sports Medicine at [facility] [place]           . metFORMIN (GLUCOPHAGE) 1000 MG tablet [Pharmacy Med Name: METFORMIN HCL 1000 MG TAB] 180 tablet 2    Sig: TAKE 1 TABLET BY MOUTH 2 TIMES DAILY     Endocrinology:  Diabetes - Biguanides Failed - 02/15/2022  1:05 PM      Failed - B12 Level in normal range and within 720 days    No results found for: "VITAMINB12"       Passed - Cr in normal range and within 360 days    Creatinine, Ser  Date Value Ref Range Status  09/01/2021 0.98 0.57 - 1.00 mg/dL Final         Passed - HBA1C is between 0 and 7.9 and within 180 days    Hgb A1c MFr Bld  Date Value Ref Range Status  09/01/2021 5.9 (H) 4.8 - 5.6 % Final  Comment:             Prediabetes: 5.7 - 6.4          Diabetes: >6.4          Glycemic control for adults with diabetes: <7.0          Passed - eGFR in normal range and within 360 days    GFR calc Af Amer  Date Value Ref Range Status  08/14/2019 85 >59 mL/min/1.73 Final   GFR calc non Af Amer  Date Value Ref Range Status  08/14/2019 74 >59 mL/min/1.73 Final   eGFR  Date Value Ref Range Status  09/01/2021 67 >59 mL/min/1.73 Final         Passed - Valid encounter within last 6 months    Recent Outpatient Visits          5 months ago Annual physical exam   [place] [facility] at [facility], Jesse Sans, MD   1 year ago Annual physical exam   [place] [facility] at [facility], Jesse Sans, MD   2 years ago Annual physical exam   [place] [facility] at [facility], Jesse Sans, MD   3 years ago Annual physical exam   [place] [facility] at [facility], Jesse Sans, MD   4 years ago Annual physical exam   [place] [facility] at [facility], Jesse Sans, MD      Future Appointments            In 6 months Glean Hess, MD [facility] Primary Care and Sports Medicine at [facility] [place] within normal limits and completed in the last 12 months    WBC  Date Value Ref Range Status  09/01/2021 7.1 3.4 - 10.8 x10E3/uL Final   RBC  Date Value Ref Range Status  09/01/2021 5.05 3.77 - 5.28 x10E6/uL Final   Hemoglobin  Date Value Ref Range Status  09/01/2021 15.0 11.1 - 15.9 g/dL Final   Hematocrit  Date Value Ref Range Status  09/01/2021 44.3 34.0 - 46.6 % Final   MCHC  Date Value Ref Range Status  09/01/2021 33.9 31.5 - 35.7 g/dL Final   [facility]  Date Value Ref Range Status  09/01/2021 29.7 26.6 - 33.0 pg Final   MCV  Date Value Ref Range Status  09/01/2021 88 79 - 97 fL Final   No results found for: "PLTCOUNTKUC", "LABPLAT", "POCPLA" RDW  Date Value Ref Range Status  09/01/2021 11.9 11.7 - 15.4 % Final

## 2022-03-09 ENCOUNTER — Ambulatory Visit: Payer: 59 | Admitting: Internal Medicine

## 2022-04-02 ENCOUNTER — Ambulatory Visit
Admission: RE | Admit: 2022-04-02 | Discharge: 2022-04-02 | Disposition: A | Discharge status: Home / Self Care | Payer: 59 | Source: Ambulatory Visit | Attending: Internal Medicine | Admitting: Internal Medicine

## 2022-04-02 DIAGNOSIS — Z1231 Encounter for screening mammogram for malignant neoplasm of breast: Secondary | ICD-10-CM | POA: Insufficient documentation

## 2022-06-06 ENCOUNTER — Other Ambulatory Visit: Payer: Self-pay | Admitting: Internal Medicine

## 2022-06-06 DIAGNOSIS — I10 Essential (primary) hypertension: Secondary | ICD-10-CM

## 2022-09-07 ENCOUNTER — Encounter: Payer: Self-pay | Admitting: Internal Medicine

## 2022-09-07 ENCOUNTER — Ambulatory Visit (INDEPENDENT_AMBULATORY_CARE_PROVIDER_SITE_OTHER): Payer: 59 | Admitting: Internal Medicine

## 2022-09-07 VITALS — BP 122/76 | HR 83 | Ht 63.0 in | Wt 201.0 lb

## 2022-09-07 DIAGNOSIS — E785 Hyperlipidemia, unspecified: Secondary | ICD-10-CM

## 2022-09-07 DIAGNOSIS — R7303 Prediabetes: Secondary | ICD-10-CM | POA: Diagnosis not present

## 2022-09-07 DIAGNOSIS — Z1231 Encounter for screening mammogram for malignant neoplasm of breast: Secondary | ICD-10-CM | POA: Diagnosis not present

## 2022-09-07 DIAGNOSIS — E282 Polycystic ovarian syndrome: Secondary | ICD-10-CM | POA: Diagnosis not present

## 2022-09-07 DIAGNOSIS — Z1211 Encounter for screening for malignant neoplasm of colon: Secondary | ICD-10-CM | POA: Diagnosis not present

## 2022-09-07 DIAGNOSIS — Z0000 Encounter for general adult medical examination without abnormal findings: Secondary | ICD-10-CM | POA: Diagnosis not present

## 2022-09-07 DIAGNOSIS — F39 Unspecified mood [affective] disorder: Secondary | ICD-10-CM

## 2022-09-07 DIAGNOSIS — I10 Essential (primary) hypertension: Secondary | ICD-10-CM | POA: Diagnosis not present

## 2022-09-07 MED ORDER — SPIRONOLACTONE 50 MG PO TABS: 50.0000 mg | ORAL_TABLET | Freq: Two times a day (BID) | ORAL | 3 refills | Status: DC

## 2022-09-07 MED ORDER — ESCITALOPRAM OXALATE 10 MG PO TABS: 10.0000 mg | ORAL_TABLET | Freq: Every day | ORAL | 3 refills | Status: DC

## 2022-09-07 MED ORDER — METFORMIN HCL 1000 MG PO TABS: 1000.0000 mg | ORAL_TABLET | Freq: Two times a day (BID) | ORAL | 3 refills | Status: DC

## 2022-09-07 MED ORDER — SIMVASTATIN 20 MG PO TABS: 20.0000 mg | ORAL_TABLET | Freq: Every day | ORAL | 3 refills | Status: DC

## 2022-09-07 NOTE — Assessment & Plan Note (Signed)
Lab Results  Component Value Date   HGBA1C 5.9 (H) 09/01/2021  On metformin for metabolic syndrome related to PCOS

## 2022-09-07 NOTE — Assessment & Plan Note (Signed)
Tolerating statin medications without concerns LDL is  Lab Results  Component Value Date   LDLCALC 113 (H) 09/01/2021   with a goal of < 70. Current dose will be adjusted if needed.

## 2022-09-07 NOTE — Assessment & Plan Note (Signed)
Clinically stable on current regimen with good control of symptoms, No SI or HI. No change in management at this time.  

## 2022-09-07 NOTE — Progress Notes (Signed)
Date:  09/07/2022   Name:  Taylor Serrano   DOB:  March 19, 1961   MRN:  [id number]   Chief Complaint: Annual Exam Taylor Serrano is a 62 y.o. female who presents today for her Complete Annual Exam. She feels well. She reports exercising. She reports she is sleeping well. Breast complaints - none.  Mammogram: 03/2022 DEXA: none Pap smear: discontinued Colonoscopy: FIT 09/2021 neg  Health Maintenance Due  Topic Date Due   COVID-19 Vaccine (1) Never done   DTaP/Tdap/Td (1 - Tdap) Never done   COLONOSCOPY (Pts 45-25yrs Insurance coverage will need to be confirmed)  Never done   Zoster Vaccines- Shingrix (1 of 2) Never done     There is no immunization history on file for this patient.  Hypertension This is a chronic problem. The problem is controlled. Pertinent negatives include no chest pain, headaches, palpitations or shortness of breath. Past treatments include diuretics and ACE inhibitors. The current treatment provides significant improvement. There is no history of kidney disease, CAD/MI or CVA.  Hyperlipidemia This is a chronic problem. The problem is controlled. Pertinent negatives include no chest pain or shortness of breath. Current antihyperlipidemic treatment includes statins. The current treatment provides significant improvement of lipids.  Depression        This is a chronic problem.The problem is unchanged.  Associated symptoms include no fatigue and no headaches.  Past treatments include SSRIs - Selective serotonin reuptake inhibitors. Diabetes She presents for her follow-up diabetic visit. Diabetes type: prediabetes. Her disease course has been stable. Pertinent negatives for hypoglycemia include no dizziness, headaches, nervousness/anxiousness or tremors. Pertinent negatives for diabetes include no chest pain, no fatigue, no polydipsia and no polyuria. Pertinent negatives for diabetic complications include no CVA.    Lab Results  Component Value Date   NA 140  09/01/2021   K 4.8 09/01/2021   CO2 25 09/01/2021   GLUCOSE 103 (H) 09/01/2021   BUN 14 09/01/2021   CREATININE 0.98 09/01/2021   CALCIUM 10.3 (H) 09/01/2021   EGFR 67 09/01/2021   GFRNONAA 74 08/14/2019   Lab Results  Component Value Date   CHOL 184 09/01/2021   HDL 48 09/01/2021   LDLCALC 113 (H) 09/01/2021   TRIG 127 09/01/2021   CHOLHDL 3.8 09/01/2021   Lab Results  Component Value Date   TSH 0.697 09/01/2021   Lab Results  Component Value Date   HGBA1C 5.9 (H) 09/01/2021   Lab Results  Component Value Date   WBC 7.1 09/01/2021   HGB 15.0 09/01/2021   HCT 44.3 09/01/2021   MCV 88 09/01/2021   PLT 399 09/01/2021   Lab Results  Component Value Date   ALT 18 09/01/2021   AST 21 09/01/2021   ALKPHOS 44 09/01/2021   BILITOT 0.5 09/01/2021   Lab Results  Component Value Date   VD25OH 89.8 07/04/2016     Review of Systems  Constitutional:  Negative for chills, fatigue and fever.  HENT:  Negative for congestion, hearing loss, tinnitus, trouble swallowing and voice change.   Eyes:  Negative for visual disturbance.  Respiratory:  Negative for cough, chest tightness, shortness of breath and wheezing.   Cardiovascular:  Negative for chest pain, palpitations and leg swelling.  Gastrointestinal:  Negative for abdominal pain, constipation, diarrhea and vomiting.  Endocrine: Negative for polydipsia and polyuria.  Genitourinary:  Negative for dysuria, frequency, genital sores, vaginal bleeding and vaginal discharge.  Musculoskeletal:  Negative for arthralgias, gait problem and joint swelling.  Skin:  Negative for color change and rash.  Neurological:  Negative for dizziness, tremors, light-headedness and headaches.  Hematological:  Negative for adenopathy. Does not bruise/bleed easily.  Psychiatric/Behavioral:  Positive for depression. Negative for dysphoric mood and sleep disturbance. The patient is not nervous/anxious.     Patient Active Problem List   Diagnosis  Date Noted   Mood disorder 07/23/2018   Dyslipidemia 02/15/2015   Essential (primary) hypertension 02/15/2015   Calcium blood increased 02/15/2015   Prediabetes 02/15/2015   Bilateral polycystic ovarian syndrome 02/15/2015   Avitaminosis D 02/15/2015    Allergies  Allergen Reactions   Penicillins     Other reaction(s): Hives   Tetracyclines & Related     Other reaction(s): rash    Past Surgical History:  Procedure Laterality Date   NO PAST SURGERIES      Social History   Tobacco Use   Smoking status: Never   Smokeless tobacco: Never  Substance Use Topics   Alcohol use: No    Alcohol/week: 0.0 standard drinks of alcohol   Drug use: No     Medication list has been reviewed and updated.  Current Meds  Medication Sig   Ascorbic Acid (VITAMIN C) 1000 MG tablet Take 1,000 mg by mouth daily.   Cholecalciferol (VITAMIN D) 2000 UNITS tablet Take by mouth.   CINNAMON PO Take by mouth daily.   enalapril (VASOTEC) 2.5 MG tablet TAKE ONE TABLET (2.5 MG) BY MOUTH EVERY DAY   magnesium 30 MG tablet Take 30 mg by mouth daily.   Multiple Vitamins-Minerals (ZINC PO) Take by mouth daily.   [DISCONTINUED] escitalopram (LEXAPRO) 10 MG tablet TAKE 1 TABLET BY MOUTH DAILY   [DISCONTINUED] metFORMIN (GLUCOPHAGE) 1000 MG tablet TAKE 1 TABLET BY MOUTH 2 TIMES DAILY   [DISCONTINUED] simvastatin (ZOCOR) 20 MG tablet TAKE ONE TABLET (20 MG) BY MOUTH EVERY DAY   [DISCONTINUED] spironolactone (ALDACTONE) 50 MG tablet TAKE 1 TABLET BY MOUTH 2 TIMES DAILY       09/07/2022    9:16 AM 09/01/2021    9:18 AM 08/26/2020    9:11 AM  GAD 7 : Generalized Anxiety Score  Nervous, Anxious, on Edge 0 0 0  Control/stop worrying 0 0 0  Worry too much - different things 0 0 0  Trouble relaxing 0 0 0  Restless 0 0 0  Easily annoyed or irritable 0 0 0  Afraid - awful might happen 0 0 0  Total GAD 7 Score 0 0 0  Anxiety Difficulty Not difficult at all Not difficult at all        09/07/2022    9:16 AM  09/01/2021    9:18 AM 08/26/2020    9:11 AM  Depression screen PHQ 2/9  Decreased Interest 0 0 0  Down, Depressed, Hopeless 0 0 0  PHQ - 2 Score 0 0 0  Altered sleeping 0 1 0  Tired, decreased energy 0 0 0  Change in appetite 0 0 0  Feeling bad or failure about yourself  0 0 0  Trouble concentrating 0 0 0  Moving slowly or fidgety/restless 0 0 0  Suicidal thoughts 0 0 0  PHQ-9 Score 0 1 0  Difficult doing work/chores Not difficult at all Not difficult at all     BP Readings from Last 3 Encounters:  09/07/22 122/76  09/01/21 132/80  08/26/20 134/80    Physical Exam Vitals and nursing note reviewed.  Constitutional:      General: She is not  in acute distress.    Appearance: She is well-developed.  HENT:     Head: Normocephalic and atraumatic.     Right Ear: Tympanic membrane and ear canal normal.     Left Ear: Tympanic membrane and ear canal normal.     Nose:     Right Sinus: No maxillary sinus tenderness.     Left Sinus: No maxillary sinus tenderness.  Eyes:     General: No scleral icterus.       Right eye: No discharge.        Left eye: No discharge.     Conjunctiva/sclera: Conjunctivae normal.  Neck:     Thyroid: No thyromegaly.     Vascular: No carotid bruit.  Cardiovascular:     Rate and Rhythm: Normal rate and regular rhythm.     Pulses: Normal pulses.     Heart sounds: Normal heart sounds.  Pulmonary:     Effort: Pulmonary effort is normal. No respiratory distress.     Breath sounds: No wheezing.  Chest:  Breasts:    Right: No mass, nipple discharge, skin change or tenderness.     Left: No mass, nipple discharge, skin change or tenderness.  Abdominal:     General: Bowel sounds are normal.     Palpations: Abdomen is soft.     Tenderness: There is no abdominal tenderness.  Musculoskeletal:     Cervical back: Normal range of motion. No erythema.     Right lower leg: No edema.     Left lower leg: No edema.  Lymphadenopathy:     Cervical: No cervical  adenopathy.  Skin:    General: Skin is warm and dry.     Findings: No rash.  Neurological:     Mental Status: She is alert and oriented to person, place, and time.     Cranial Nerves: No cranial nerve deficit.     Sensory: No sensory deficit.     Deep Tendon Reflexes: Reflexes are normal and symmetric.  Psychiatric:        Attention and Perception: Attention normal.        Mood and Affect: Mood normal.     Wt Readings from Last 3 Encounters:  09/07/22 201 lb (91.2 kg)  09/01/21 199 lb 12.8 oz (90.6 kg)  08/26/20 208 lb (94.3 kg)    BP 122/76   Pulse 83   Ht 5\' 3"  (1.6 m)   Wt 201 lb (91.2 kg)   LMP 04/05/2016   SpO2 96%   BMI 35.61 kg/m   Assessment and Plan:  Problem List Items Addressed This Visit       Cardiovascular and Mediastinum   Essential (primary) hypertension (Chronic)    Clinically stable exam with well controlled BP on vasotec and spironolactone. Tolerating medications without side effects. Pt to continue current regimen and low sodium diet.       Relevant Medications   spironolactone (ALDACTONE) 50 MG tablet   simvastatin (ZOCOR) 20 MG tablet   Other Relevant Orders   CBC with Differential/Platelet   Comprehensive metabolic panel   TSH   Urinalysis, Routine w reflex microscopic     Endocrine   Bilateral polycystic ovarian syndrome (Chronic)   Relevant Medications   metFORMIN (GLUCOPHAGE) 1000 MG tablet   spironolactone (ALDACTONE) 50 MG tablet     Other   Mood disorder    Clinically stable on current regimen with good control of symptoms, No SI or HI. No change in management at this time.  Relevant Medications   escitalopram (LEXAPRO) 10 MG tablet   Dyslipidemia (Chronic)    Tolerating statin medications without concerns LDL is  Lab Results  Component Value Date   LDLCALC 113 (H) 09/01/2021  with a goal of < 70. Current dose will be adjusted if needed.       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      Relevant Orders   Hemoglobin A1c   Other Visit Diagnoses     Annual physical exam    -  Primary   Normal exam except for weight continue healthy diet, exercise she declines all immunizations   Relevant Orders   CBC with Differential/Platelet   Comprehensive metabolic panel   Hemoglobin A1c   Lipid panel   TSH   Encounter for screening mammogram for breast cancer       [facility]   Relevant Orders   MM 3D SCREENING MAMMOGRAM BILATERAL BREAST   Colon cancer screening       declines colonoscopy will do FIT   Relevant Orders   Fecal occult blood, imunochemical       Return in about 6 months (around 03/09/2023) for HTN.   Partially dictated using Dragon software, any errors are not intentional.  Reubin MilanLaura H. Ilyse Tremain, MD [facility] [place], [place]

## 2022-09-07 NOTE — Patient Instructions (Signed)
Call [facility] to schedule your mammogram at [phone].  

## 2022-09-07 NOTE — Assessment & Plan Note (Signed)
Clinically stable exam with well controlled BP on vasotec and spironolactone. Tolerating medications without side effects. Pt to continue current regimen and low sodium diet.

## 2022-09-08 LAB — URINALYSIS, ROUTINE W REFLEX MICROSCOPIC
Bilirubin, UA: NEGATIVE
Glucose, UA: NEGATIVE
Ketones, UA: NEGATIVE
Leukocytes,UA: NEGATIVE
Nitrite, UA: NEGATIVE
RBC, UA: NEGATIVE
Specific Gravity, UA: 1.02 (ref 1.005–1.030)
Urobilinogen, Ur: 0.2 mg/dL (ref 0.2–1.0)
pH, UA: 8 — ABNORMAL HIGH (ref 5.0–7.5)

## 2022-09-08 LAB — COMPREHENSIVE METABOLIC PANEL
ALT: 23 IU/L (ref 0–32)
AST: 21 IU/L (ref 0–40)
Albumin/Globulin Ratio: 2 (ref 1.2–2.2)
Albumin: 4.9 g/dL (ref 3.8–4.9)
Alkaline Phosphatase: 50 IU/L (ref 44–121)
BUN/Creatinine Ratio: 14 (ref 9–23)
BUN: 13 mg/dL (ref 6–24)
Bilirubin Total: 0.5 mg/dL (ref 0.0–1.2)
CO2: 26 mmol/L (ref 20–29)
Calcium: 10.8 mg/dL — ABNORMAL HIGH (ref 8.7–10.2)
Chloride: 100 mmol/L (ref 96–106)
Creatinine, Ser: 0.94 mg/dL (ref 0.57–1.00)
Globulin, Total: 2.4 g/dL (ref 1.5–4.5)
Glucose: 106 mg/dL — ABNORMAL HIGH (ref 70–99)
Potassium: 5 mmol/L (ref 3.5–5.2)
Sodium: 140 mmol/L (ref 134–144)
Total Protein: 7.3 g/dL (ref 6.0–8.5)
eGFR: 70 mL/min/{1.73_m2} (ref >59)

## 2022-09-08 LAB — HEMOGLOBIN A1C
Est. average glucose Bld gHb Est-mCnc: 126 mg/dL
Hgb A1c MFr Bld: 6 % — ABNORMAL HIGH (ref 4.8–5.6)

## 2022-09-08 LAB — CBC WITH DIFFERENTIAL/PLATELET
Basophils Absolute: 0.1 10*3/uL (ref 0.0–0.2)
Basos: 2 %
EOS (ABSOLUTE): 0.4 10*3/uL (ref 0.0–0.4)
Eos: 6 %
Hematocrit: 44.5 % (ref 34.0–46.6)
Hemoglobin: 15.3 g/dL (ref 11.1–15.9)
Immature Grans (Abs): 0 10*3/uL (ref 0.0–0.1)
Immature Granulocytes: 0 %
Lymphocytes Absolute: 2.2 10*3/uL (ref 0.7–3.1)
Lymphs: 34 %
MCH: 30.2 pg (ref 26.6–33.0)
MCHC: 34.4 g/dL (ref 31.5–35.7)
MCV: 88 fL (ref 79–97)
Monocytes Absolute: 0.7 10*3/uL (ref 0.1–0.9)
Monocytes: 11 %
Neutrophils Absolute: 3.2 10*3/uL (ref 1.4–7.0)
Neutrophils: 47 %
Platelets: 386 10*3/uL (ref 150–450)
RBC: 5.07 x10E6/uL (ref 3.77–5.28)
RDW: 11.9 % (ref 11.7–15.4)
WBC: 6.7 10*3/uL (ref 3.4–10.8)

## 2022-09-08 LAB — LIPID PANEL
Chol/HDL Ratio: 3.4 ratio (ref 0.0–4.4)
Cholesterol, Total: 171 mg/dL (ref 100–199)
HDL: 50 mg/dL (ref >39)
LDL Chol Calc (NIH): 95 mg/dL (ref 0–99)
Triglycerides: 150 mg/dL — ABNORMAL HIGH (ref 0–149)
VLDL Cholesterol Cal: 26 mg/dL (ref 5–40)

## 2022-09-08 LAB — TSH: TSH: 0.79 u[IU]/mL (ref 0.450–4.500)

## 2022-09-12 IMAGING — MG MM DIGITAL SCREENING BILAT W/ TOMO AND CAD
6 of 10 series · 6 of 30 positions shown · non-contrast
Comparison: Previous exam(s).

CLINICAL DATA: Screening.

EXAM:
DIGITAL SCREENING BILATERAL MAMMOGRAM WITH TOMOSYNTHESIS AND CAD
TECHNIQUE: Bilateral screening digital craniocaudal and mediolateral oblique
mammograms were obtained. Bilateral screening digital breast
tomosynthesis was performed. The images were evaluated with
computer-aided detection.

[L MLO synth-2D]
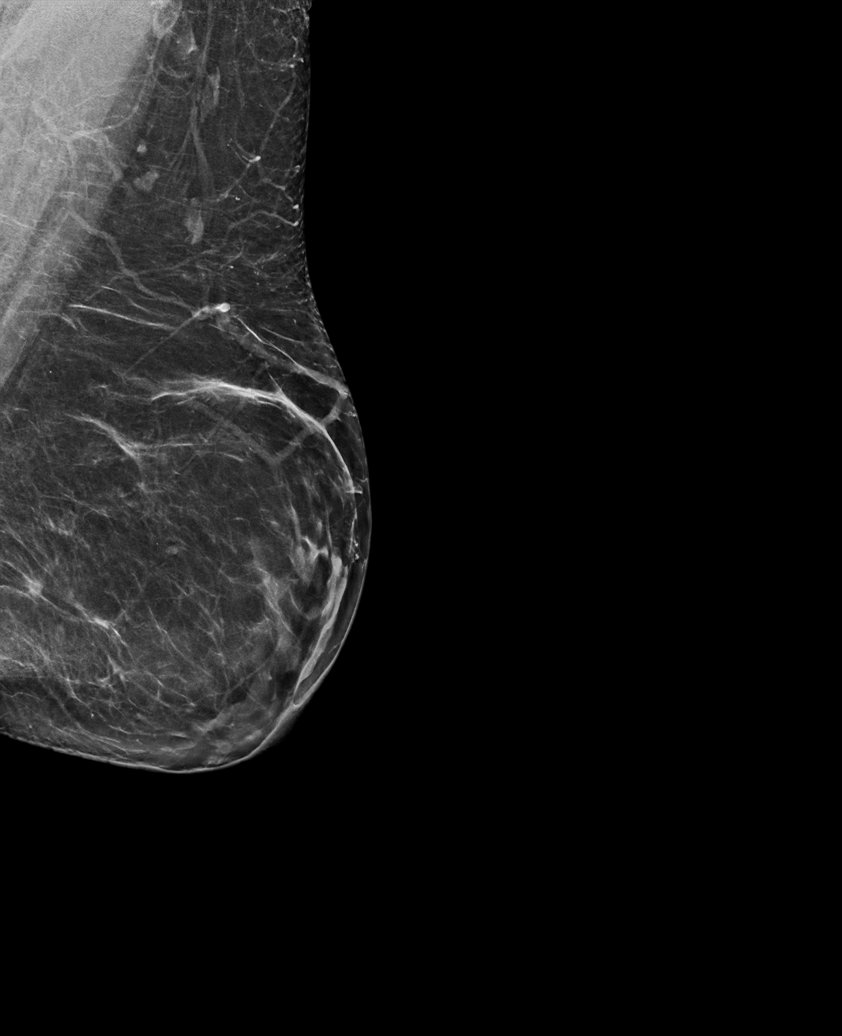

[R CC synth-2D]
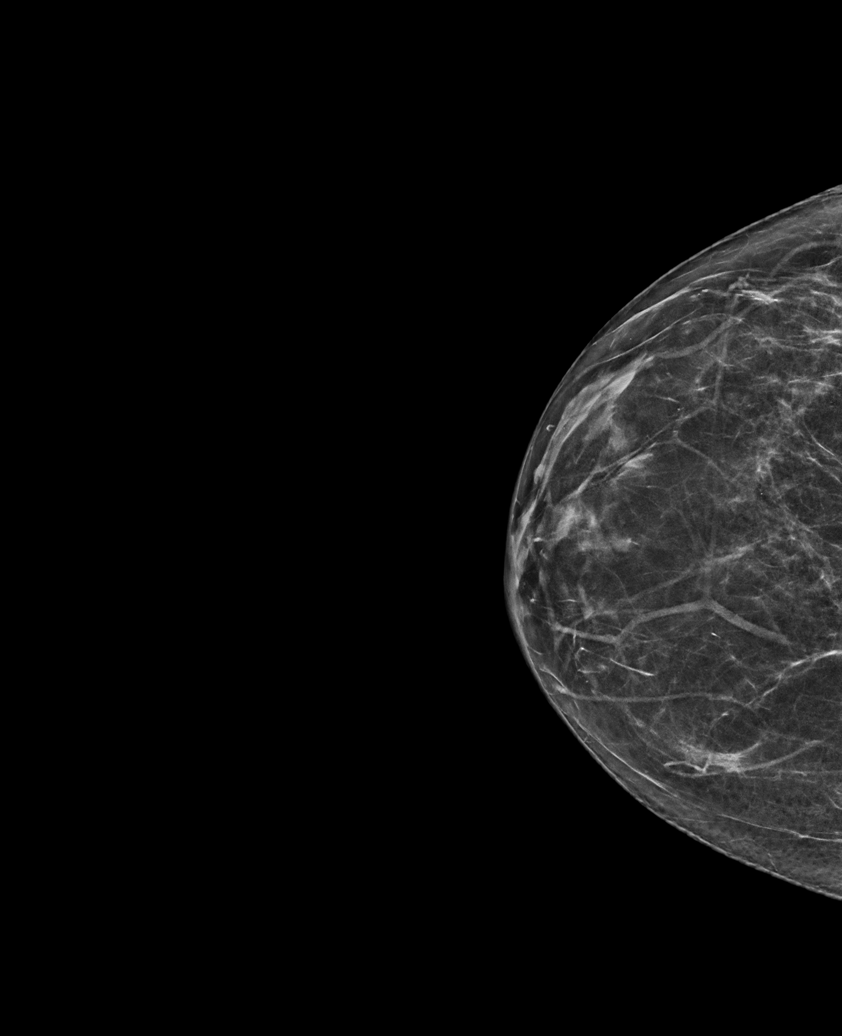

[R MLO synth-2D (1 of 2)]
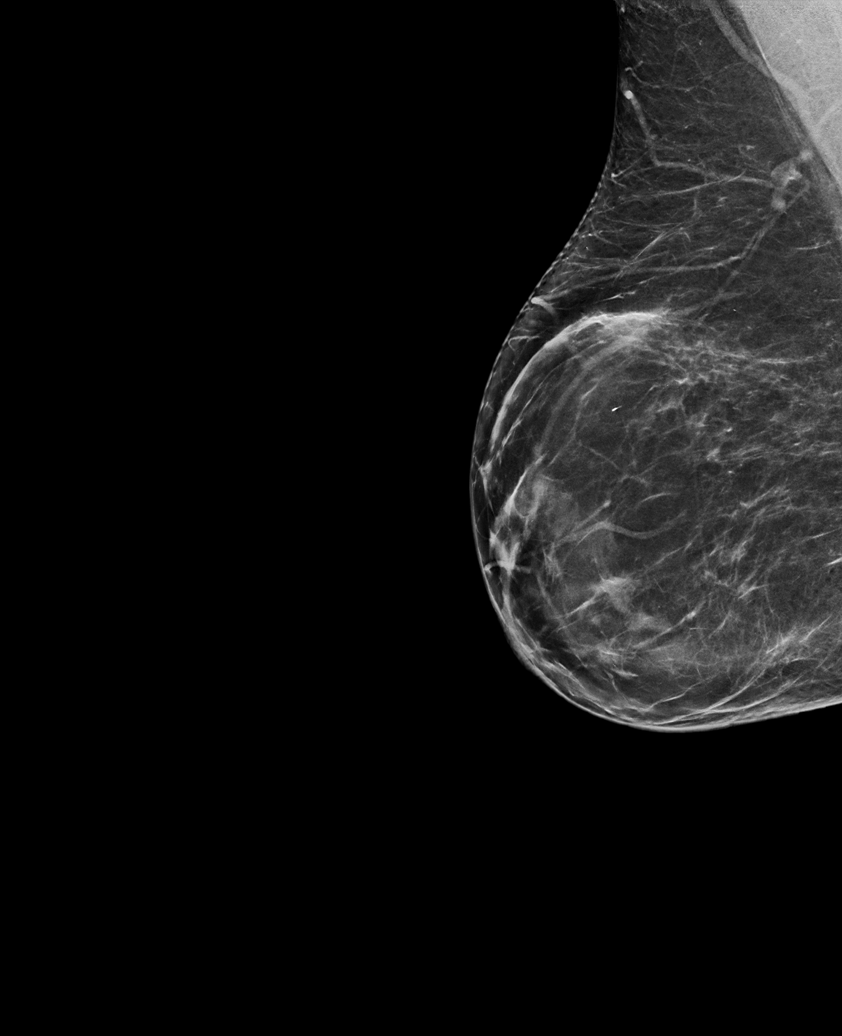

[L CC synth-2D]
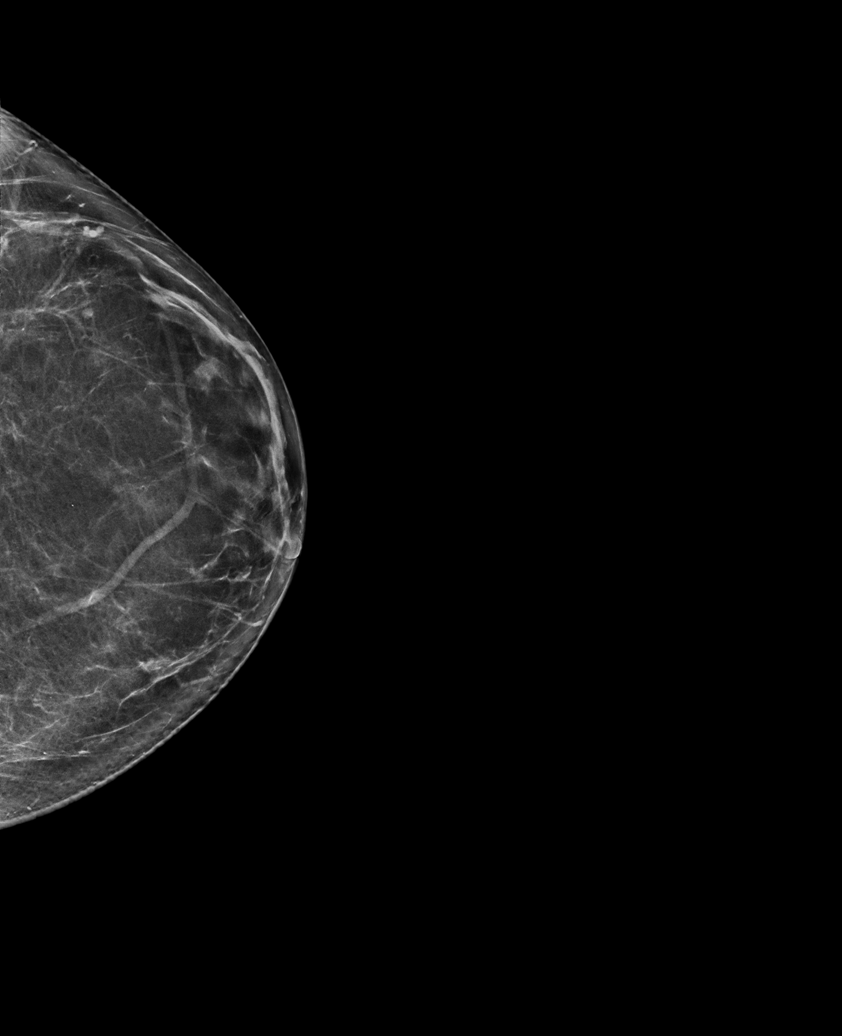

[R MLO synth-2D (2 of 2)]
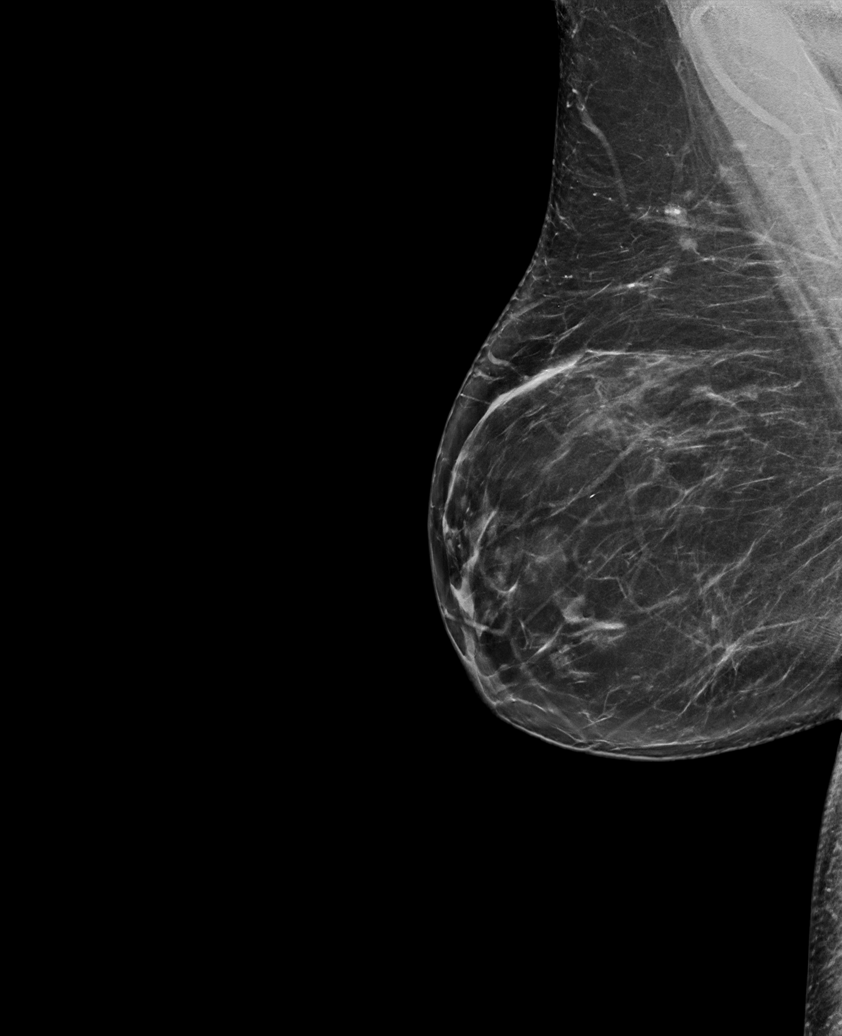

[R MLO tomo · tomo slice 41/80.0]
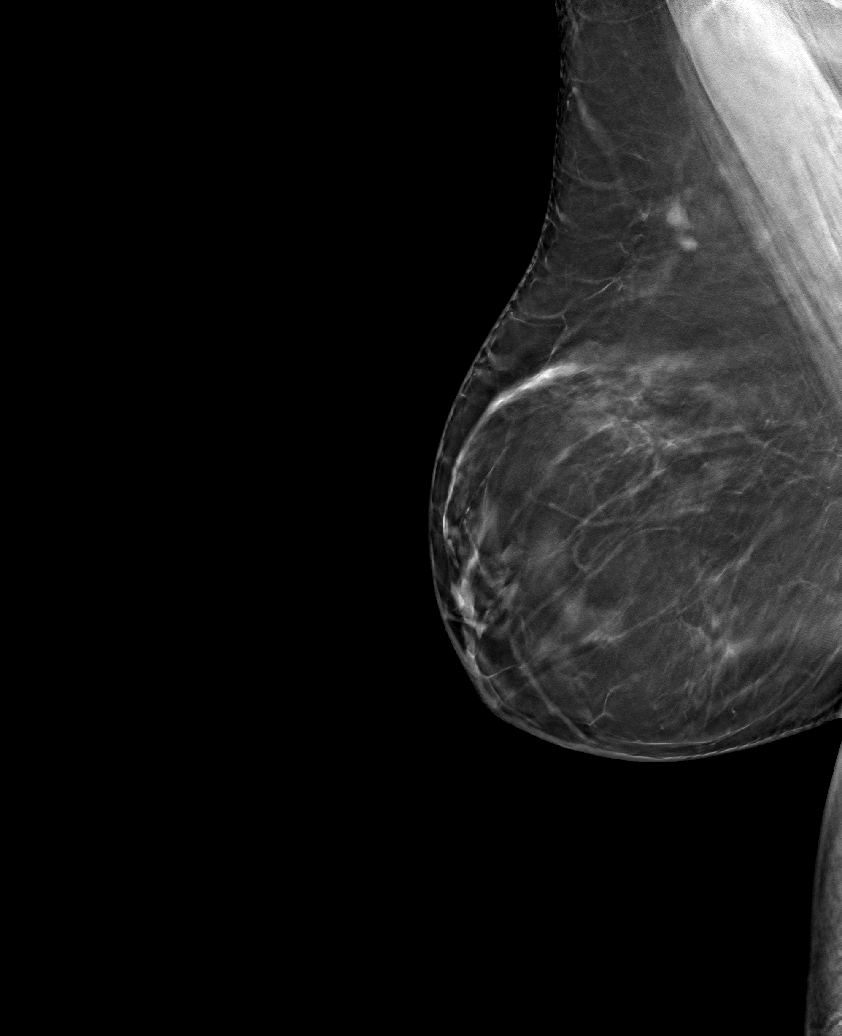

[6 of 30 positions shown; findings below may reference images not displayed]

ACR Breast Density Category b: There are scattered areas of
fibroglandular density.
FINDINGS: There are no findings suspicious for malignancy.
IMPRESSION: No mammographic evidence of malignancy. A result letter of this
screening mammogram will be mailed directly to the patient.

RECOMMENDATION:
Screening mammogram in one year. (Code:51-O-LD2)

BI-RADS CATEGORY  1: Negative.

## 2022-10-04 DIAGNOSIS — Z1211 Encounter for screening for malignant neoplasm of colon: Secondary | ICD-10-CM | POA: Diagnosis not present

## 2022-10-10 LAB — FECAL OCCULT BLOOD, IMMUNOCHEMICAL: Fecal Occult Bld: NEGATIVE

## 2023-01-08 ENCOUNTER — Other Ambulatory Visit: Payer: Self-pay | Admitting: Internal Medicine

## 2023-01-08 DIAGNOSIS — I10 Essential (primary) hypertension: Secondary | ICD-10-CM

## 2023-01-09 ENCOUNTER — Telehealth: Payer: Self-pay

## 2023-01-09 NOTE — Telephone Encounter (Signed)
Left voice mail to set up appointment in October for HTN/Pre DM

## 2023-01-09 NOTE — Telephone Encounter (Signed)
Completed PA on [email or web address] for Enalapril 2.5 mg.  (Key: [id number]) PA Case ID #: [id number] Rx #: [id number]

## 2023-04-05 ENCOUNTER — Ambulatory Visit
Admission: RE | Admit: 2023-04-05 | Discharge: 2023-04-05 | Disposition: A | Discharge status: Home / Self Care | Payer: 59 | Source: Ambulatory Visit | Attending: Internal Medicine | Admitting: Internal Medicine

## 2023-04-05 DIAGNOSIS — Z1231 Encounter for screening mammogram for malignant neoplasm of breast: Secondary | ICD-10-CM | POA: Diagnosis not present

## 2023-04-10 ENCOUNTER — Other Ambulatory Visit: Payer: Self-pay | Admitting: Internal Medicine

## 2023-04-10 DIAGNOSIS — I10 Essential (primary) hypertension: Secondary | ICD-10-CM

## 2023-04-11 ENCOUNTER — Other Ambulatory Visit: Payer: Self-pay

## 2023-04-11 NOTE — Telephone Encounter (Signed)
Please call pt to schedule an appt.  KP

## 2023-04-11 NOTE — Telephone Encounter (Signed)
Requested medication (s) are due for refill today: yes  Requested medication (s) are on the active medication list: yes  Last refill:  01/08/23 #90  Future visit scheduled: yes  Notes to clinic:  overdue lab work   Requested Prescriptions  Pending Prescriptions Disp Refills   enalapril (VASOTEC) 2.5 MG tablet [Pharmacy Med Name: ENALAPRIL MALEATE 2.5 MG TAB] 90 tablet 0    Sig: TAKE 1 TABLET BY MOUTH DAILY     Cardiovascular:  ACE Inhibitors Failed - 04/10/2023 12:06 PM      Failed - Cr in normal range and within 180 days    Creatinine, Ser  Date Value Ref Range Status  09/07/2022 0.94 0.57 - 1.00 mg/dL Final         Failed - K in normal range and within 180 days    Potassium  Date Value Ref Range Status  09/07/2022 5.0 3.5 - 5.2 mmol/L Final         Failed - Valid encounter within last 6 months    Recent Outpatient Visits           7 months ago Annual physical exam   [place] [facility], Nyoka Cowden, MD   1 year ago Annual physical exam   [place] [facility] at [facility], Nyoka Cowden, MD   2 years ago Annual physical exam   [facility] at [facility], Nyoka Cowden, MD   3 years ago Annual physical exam   [facility] Primary Care [facility] Sports Medicine at [facility], Nyoka Cowden, MD   4 years ago Annual physical exam   [facility] & Sports Medicine at [facility], Nyoka Cowden, MD       Future Appointments             In 5 months Judithann Graves Nyoka Cowden, MD [facility] Primary Care & Sports Medicine at [facility], [facility]            Passed - Patient is not pregnant      Passed - Last BP in normal range    BP Readings from Last 1 Encounters:  09/07/22 122/76

## 2023-05-16 ENCOUNTER — Other Ambulatory Visit: Payer: Self-pay | Admitting: Internal Medicine

## 2023-05-16 DIAGNOSIS — I10 Essential (primary) hypertension: Secondary | ICD-10-CM

## 2023-05-16 NOTE — Telephone Encounter (Signed)
Requested Prescriptions  Pending Prescriptions Disp Refills   enalapril (VASOTEC) 2.5 MG tablet [Pharmacy Med Name: ENALAPRIL MALEATE 2.5 MG TAB] 30 tablet 0    Sig: TAKE 1 TABLET BY MOUTH DAILY.     Cardiovascular:  ACE Inhibitors Failed - 05/16/2023  1:28 PM      Failed - Cr in normal range and within 180 days    Creatinine, Ser  Date Value Ref Range Status  09/07/2022 0.94 0.57 - 1.00 mg/dL Final         Failed - K in normal range and within 180 days    Potassium  Date Value Ref Range Status  09/07/2022 5.0 3.5 - 5.2 mmol/L Final         Failed - Valid encounter within last 6 months    Recent Outpatient Visits           8 months ago Annual physical exam   [place] [facility] at [facility], Nyoka Cowden, MD   1 year ago Annual physical exam   [facility] at [facility], Nyoka Cowden, MD   2 years ago Annual physical exam   [facility] Primary Care & Sports Medicine at [facility], Nyoka Cowden, MD   3 years ago Annual physical exam   [facility] Primary Care & Sports Medicine at [facility], Nyoka Cowden, MD   4 years ago Annual physical exam   [facility] Primary Care & Sports Medicine at [facility], Nyoka Cowden, MD       Future Appointments             In 4 months Judithann Graves Nyoka Cowden, MD [facility] Primary Care & Sports Medicine at [facility], [facility]            Passed - Patient is not pregnant      Passed - Last BP in normal range    BP Readings from Last 1 Encounters:  09/07/22 122/76

## 2023-06-19 ENCOUNTER — Other Ambulatory Visit: Payer: Self-pay | Admitting: Internal Medicine

## 2023-06-19 DIAGNOSIS — I10 Essential (primary) hypertension: Secondary | ICD-10-CM

## 2023-06-20 NOTE — Telephone Encounter (Signed)
Requested medication (s) are due for refill today:   Yes  #30 day courtesy supply was given  Requested medication (s) are on the active medication list:   Yes  Future visit scheduled:   Yes 09/13/2023 for annual physical   Last ordered: 05/15/2024 #30, 0 refills  Unable to refill because labs and a 6 month OV due per protocol however pt has an upcoming appt for yearly physical.   Requested Prescriptions  Pending Prescriptions Disp Refills   enalapril (VASOTEC) 2.5 MG tablet [Pharmacy Med Name: ENALAPRIL MALEATE 2.5 MG TAB] 30 tablet 0    Sig: TAKE 1 TABLET BY MOUTH DAILY.     Cardiovascular:  ACE Inhibitors Failed - 06/20/2023  8:47 AM      Failed - Cr in normal range and within 180 days    Creatinine, Ser  Date Value Ref Range Status  09/07/2022 0.94 0.57 - 1.00 mg/dL Final         Failed - K in normal range and within 180 days    Potassium  Date Value Ref Range Status  09/07/2022 5.0 3.5 - 5.2 mmol/L Final         Failed - Valid encounter within last 6 months    Recent Outpatient Visits           9 months ago Annual physical exam   [place] [facility] at [facility], Nyoka Cowden, MD   1 year ago Annual physical exam   [facility] Primary Care & Sports Medicine at [facility], Nyoka Cowden, MD   2 years ago Annual physical exam   [facility] Primary Care & Sports Medicine at [facility], Nyoka Cowden, MD   3 years ago Annual physical exam   [facility] Primary Care & Sports Medicine at [facility], Nyoka Cowden, MD   4 years ago Annual physical exam   [facility] Primary Care & Sports Medicine at [facility], Nyoka Cowden, MD       Future Appointments             In 2 months Judithann Graves Nyoka Cowden, MD [facility] Primary Care & Sports Medicine at [facility], [facility]            Passed - Patient is not pregnant      Passed - Last BP in normal range    BP Readings from Last 1 Encounters:   09/07/22 122/76

## 2023-08-21 ENCOUNTER — Other Ambulatory Visit: Payer: Self-pay | Admitting: Internal Medicine

## 2023-08-21 DIAGNOSIS — I10 Essential (primary) hypertension: Secondary | ICD-10-CM

## 2023-08-22 NOTE — Telephone Encounter (Signed)
 Rx 06/20/23 #30 2RF- keep appointment for RF Requested Prescriptions  Pending Prescriptions Disp Refills   enalapril (VASOTEC) 2.5 MG tablet [Pharmacy Med Name: ENALAPRIL MALEATE 2.5 MG TAB] 30 tablet 2    Sig: TAKE 1 TABLET BY MOUTH DAILY.     Cardiovascular:  ACE Inhibitors Failed - 08/22/2023 12:03 PM      Failed - Cr in normal range and within 180 days    Creatinine, Ser  Date Value Ref Range Status  09/07/2022 0.94 0.57 - 1.00 mg/dL Final         Failed - K in normal range and within 180 days    Potassium  Date Value Ref Range Status  09/07/2022 5.0 3.5 - 5.2 mmol/L Final         Failed - Valid encounter within last 6 months    Recent Outpatient Visits           11 months ago Annual physical exam   [facility], Nyoka Cowden, MD   1 year ago Annual physical exam   [facility] at [facility], Nyoka Cowden, MD   2 years ago Annual physical exam   [facility] at [facility], Nyoka Cowden, MD   4 years ago Annual physical exam   [facility] at [facility], Nyoka Cowden, MD   5 years ago Annual physical exam   [facility] at [facility], Nyoka Cowden, MD       Future Appointments             In 3 weeks Judithann Graves Nyoka Cowden, MD [facility] at [facility], [facility]            Passed - Patient is not pregnant      Passed - Last BP in normal range    BP Readings from Last 1 Encounters:  09/07/22 122/76

## 2023-09-13 ENCOUNTER — Encounter: Payer: Self-pay | Admitting: Internal Medicine

## 2023-09-13 ENCOUNTER — Ambulatory Visit (INDEPENDENT_AMBULATORY_CARE_PROVIDER_SITE_OTHER): Payer: Self-pay | Admitting: Internal Medicine

## 2023-09-13 VITALS — BP 112/68 | HR 82 | Ht 63.0 in | Wt 195.1 lb

## 2023-09-13 DIAGNOSIS — E785 Hyperlipidemia, unspecified: Secondary | ICD-10-CM | POA: Diagnosis not present

## 2023-09-13 DIAGNOSIS — I10 Essential (primary) hypertension: Secondary | ICD-10-CM

## 2023-09-13 DIAGNOSIS — R7303 Prediabetes: Secondary | ICD-10-CM

## 2023-09-13 DIAGNOSIS — Z1231 Encounter for screening mammogram for malignant neoplasm of breast: Secondary | ICD-10-CM | POA: Diagnosis not present

## 2023-09-13 DIAGNOSIS — E282 Polycystic ovarian syndrome: Secondary | ICD-10-CM

## 2023-09-13 DIAGNOSIS — Z1211 Encounter for screening for malignant neoplasm of colon: Secondary | ICD-10-CM | POA: Diagnosis not present

## 2023-09-13 DIAGNOSIS — Z0000 Encounter for general adult medical examination without abnormal findings: Secondary | ICD-10-CM | POA: Diagnosis not present

## 2023-09-13 DIAGNOSIS — F39 Unspecified mood [affective] disorder: Secondary | ICD-10-CM

## 2023-09-13 MED ORDER — SPIRONOLACTONE 50 MG PO TABS: 50.0000 mg | ORAL_TABLET | Freq: Two times a day (BID) | ORAL | 3 refills | Status: AC

## 2023-09-13 MED ORDER — METFORMIN HCL 1000 MG PO TABS: 1000.0000 mg | ORAL_TABLET | Freq: Two times a day (BID) | ORAL | 3 refills | Status: AC

## 2023-09-13 MED ORDER — ENALAPRIL MALEATE 2.5 MG PO TABS: 2.5000 mg | ORAL_TABLET | Freq: Every day | ORAL | 3 refills | Status: AC

## 2023-09-13 MED ORDER — ESCITALOPRAM OXALATE 10 MG PO TABS: 10.0000 mg | ORAL_TABLET | Freq: Every day | ORAL | 3 refills | Status: AC

## 2023-09-13 MED ORDER — SIMVASTATIN 20 MG PO TABS
20.0000 mg | ORAL_TABLET | Freq: Every day | ORAL | 3 refills | Status: AC
Start: 2023-09-13 — End: ?

## 2023-09-13 NOTE — Progress Notes (Signed)
 Date:  09/13/2023   Name:  Taylor Serrano   DOB:  May 17, 1961   MRN:  [id number]   Chief Complaint: Annual Exam Taylor Serrano is a 63 y.o. female who presents today for her Complete Annual Exam. She feels well. She reports exercising lifts weights, 3 times a week. She reports she is sleeping well. Breast complaints none.  She continues to decline Pap, will continue annual FIT.  Health Maintenance  Topic Date Due   DTaP/Tdap/Td vaccine (1 - Tdap) Never done   Colon Cancer Screening  Never done   Zoster (Shingles) Vaccine (1 of 2) Never done   COVID-19 Vaccine (1 - 2024-25 season) 09/29/2023*   Stool Blood Test  10/04/2023   Flu Shot  01/03/2024   Mammogram  04/04/2024   Hepatitis C Screening  Completed   HIV Screening  Completed   HPV Vaccine  Aged Out   Meningitis B Vaccine  Aged Out  *Topic was postponed. The date shown is not the original due date.    Hypertension This is a chronic problem. The problem is controlled. Pertinent negatives include no chest pain, headaches, palpitations or shortness of breath. Past treatments include ACE inhibitors and diuretics. The current treatment provides significant improvement.  Hyperlipidemia This is a chronic problem. Pertinent negatives include no chest pain or shortness of breath. Current antihyperlipidemic treatment includes statins. The current treatment provides significant improvement of lipids.  Depression        This is a chronic problem.The problem is unchanged.  Associated symptoms include no fatigue, no appetite change and no headaches.  Past treatments include SSRIs - Selective serotonin reuptake inhibitors.   Review of Systems  Constitutional:  Negative for appetite change, fatigue, fever and unexpected weight change.  HENT:  Negative for tinnitus and trouble swallowing.   Eyes:  Negative for visual disturbance.  Respiratory:  Negative for cough, chest tightness and shortness of breath.   Cardiovascular:  Negative for  chest pain, palpitations and leg swelling.  Gastrointestinal:  Negative for abdominal pain.  Endocrine: Negative for polydipsia and polyuria.  Genitourinary:  Negative for dysuria and hematuria.  Musculoskeletal:  Negative for arthralgias.  Neurological:  Negative for tremors, numbness and headaches.  Psychiatric/Behavioral:  Positive for depression. Negative for dysphoric mood.      Lab Results  Component Value Date   NA 140 09/07/2022   K 5.0 09/07/2022   CO2 26 09/07/2022   GLUCOSE 106 (H) 09/07/2022   BUN 13 09/07/2022   CREATININE 0.94 09/07/2022   CALCIUM 10.8 (H) 09/07/2022   EGFR 70 09/07/2022   GFRNONAA 74 08/14/2019   Lab Results  Component Value Date   CHOL 171 09/07/2022   HDL 50 09/07/2022   LDLCALC 95 09/07/2022   TRIG 150 (H) 09/07/2022   CHOLHDL 3.4 09/07/2022   Lab Results  Component Value Date   TSH 0.790 09/07/2022   Lab Results  Component Value Date   HGBA1C 6.0 (H) 09/07/2022   Lab Results  Component Value Date   WBC 6.7 09/07/2022   HGB 15.3 09/07/2022   HCT 44.5 09/07/2022   MCV 88 09/07/2022   PLT 386 09/07/2022   Lab Results  Component Value Date   ALT 23 09/07/2022   AST 21 09/07/2022   ALKPHOS 50 09/07/2022   BILITOT 0.5 09/07/2022   Lab Results  Component Value Date   VD25OH 89.8 07/04/2016     Patient Active Problem List   Diagnosis Date Noted   Mood  disorder (HCC) 07/23/2018   Dyslipidemia 02/15/2015   Essential (primary) hypertension 02/15/2015   Calcium blood increased 02/15/2015   Prediabetes 02/15/2015   Bilateral polycystic ovarian syndrome 02/15/2015   Avitaminosis D 02/15/2015    Allergies  Allergen Reactions   Penicillins     Other reaction(s): Hives   Tetracyclines & Related     Other reaction(s): rash    Past Surgical History:  Procedure Laterality Date   NO PAST SURGERIES      Social History   Tobacco Use   Smoking status: Never   Smokeless tobacco: Never  Substance Use Topics   Alcohol  use: No    Alcohol/week: 0.0 standard drinks of alcohol   Drug use: No     Medication list has been reviewed and updated.  Current Meds  Medication Sig   Ascorbic Acid (VITAMIN C) 1000 MG tablet Take 1,000 mg by mouth daily.   Cholecalciferol (VITAMIN D) 2000 UNITS tablet Take by mouth.   CINNAMON PO Take by mouth daily.   magnesium 30 MG tablet Take 30 mg by mouth daily.   Multiple Vitamins-Minerals (ZINC PO) Take by mouth daily.   [DISCONTINUED] enalapril (VASOTEC) 2.5 MG tablet TAKE 1 TABLET BY MOUTH DAILY.   [DISCONTINUED] escitalopram (LEXAPRO) 10 MG tablet Take 1 tablet (10 mg total) by mouth daily.   [DISCONTINUED] metFORMIN (GLUCOPHAGE) 1000 MG tablet Take 1 tablet (1,000 mg total) by mouth 2 (two) times daily.   [DISCONTINUED] simvastatin (ZOCOR) 20 MG tablet Take 1 tablet (20 mg total) by mouth daily at 6 PM.   [DISCONTINUED] spironolactone (ALDACTONE) 50 MG tablet Take 1 tablet (50 mg total) by mouth 2 (two) times daily.       09/13/2023    8:53 AM 09/07/2022    9:16 AM 09/01/2021    9:18 AM 08/26/2020    9:11 AM  GAD 7 : Generalized Anxiety Score  Nervous, Anxious, on Edge 0 0 0 0  Control/stop worrying 0 0 0 0  Worry too much - different things 0 0 0 0  Trouble relaxing 0 0 0 0  Restless 0 0 0 0  Easily annoyed or irritable 0 0 0 0  Afraid - awful might happen 0 0 0 0  Total GAD 7 Score 0 0 0 0  Anxiety Difficulty Not difficult at all Not difficult at all Not difficult at all        09/13/2023    8:53 AM 09/07/2022    9:16 AM 09/01/2021    9:18 AM  Depression screen PHQ 2/9  Decreased Interest 0 0 0  Down, Depressed, Hopeless 0 0 0  PHQ - 2 Score 0 0 0  Altered sleeping 0 0 1  Tired, decreased energy 0 0 0  Change in appetite 0 0 0  Feeling bad or failure about yourself  0 0 0  Trouble concentrating 0 0 0  Moving slowly or fidgety/restless 0 0 0  Suicidal thoughts 0 0 0  PHQ-9 Score 0 0 1  Difficult doing work/chores Not difficult at all Not difficult at  all Not difficult at all    BP Readings from Last 3 Encounters:  09/13/23 112/68  09/07/22 122/76  09/01/21 132/80    Physical Exam Vitals and nursing note reviewed.  Constitutional:      General: She is not in acute distress.    Appearance: She is well-developed.  HENT:     Head: Normocephalic and atraumatic.     Right Ear: Tympanic membrane  and ear canal normal.     Left Ear: Tympanic membrane and ear canal normal.     Nose:     Right Sinus: No maxillary sinus tenderness.     Left Sinus: No maxillary sinus tenderness.  Eyes:     General: No scleral icterus.       Right eye: No discharge.        Left eye: No discharge.     Conjunctiva/sclera: Conjunctivae normal.  Neck:     Thyroid: No thyromegaly.     Vascular: No carotid bruit.  Cardiovascular:     Rate and Rhythm: Normal rate and regular rhythm.     Pulses: Normal pulses.     Heart sounds: Normal heart sounds.  Pulmonary:     Effort: Pulmonary effort is normal. No respiratory distress.     Breath sounds: No wheezing.  Abdominal:     General: Bowel sounds are normal.     Palpations: Abdomen is soft.     Tenderness: There is no abdominal tenderness.  Musculoskeletal:     Cervical back: Normal range of motion. No erythema.     Right lower leg: No edema.     Left lower leg: No edema.  Lymphadenopathy:     Cervical: No cervical adenopathy.  Skin:    General: Skin is warm and dry.     Capillary Refill: Capillary refill takes less than 2 seconds.     Findings: No rash.  Neurological:     General: No focal deficit present.     Mental Status: She is alert and oriented to person, place, and time.     Cranial Nerves: No cranial nerve deficit.     Sensory: No sensory deficit.     Deep Tendon Reflexes: Reflexes are normal and symmetric.  Psychiatric:        Attention and Perception: Attention normal.        Mood and Affect: Mood normal.        Behavior: Behavior normal.     Wt Readings from Last 3 Encounters:   09/13/23 195 lb 2 oz (88.5 kg)  09/07/22 201 lb (91.2 kg)  09/01/21 199 lb 12.8 oz (90.6 kg)    BP 112/68   Pulse 82   Ht 5\' 3"  (1.6 m)   Wt 195 lb 2 oz (88.5 kg)   LMP 04/05/2016   SpO2 96%   BMI 34.56 kg/m   Assessment and Plan:  Problem List Items Addressed This Visit       Unprioritized   Dyslipidemia (Chronic)   LDL is  Lab Results  Component Value Date   LDLCALC 95 09/07/2022   Current regimen is simvastatin .  No medication side effects noted. Goal LDL is <70.       Relevant Medications   simvastatin (ZOCOR) 20 MG tablet   Other Relevant Orders   Lipid panel   Essential (primary) hypertension (Chronic)   Blood pressure is well controlled.  Current medications enalapril and spironolactone. Will continue same regimen along with efforts to limit dietary sodium.       Relevant Medications   simvastatin (ZOCOR) 20 MG tablet   enalapril (VASOTEC) 2.5 MG tablet   spironolactone (ALDACTONE) 50 MG tablet   Other Relevant Orders   CBC with Differential/Platelet   Comprehensive metabolic panel with GFR   TSH   Urinalysis, Routine w reflex microscopic   Prediabetes (Chronic)   Managed with MTF for PCOS as well Continue diet and exercise. Lab Results  Component Value Date   HGBA1C 6.0 (H) 09/07/2022         Relevant Orders   Hemoglobin A1c   Bilateral polycystic ovarian syndrome (Chronic)   On MTF and spironolactone. She declines pap and pelvic due to lack of symptoms       Relevant Medications   metFORMIN (GLUCOPHAGE) 1000 MG tablet   spironolactone (ALDACTONE) 50 MG tablet   Mood disorder (HCC)   Clinically stable on Celexa.   No SI or HI on evaluation. Plan to continue same medications for now.       Relevant Medications   escitalopram (LEXAPRO) 10 MG tablet   Other Visit Diagnoses       Annual physical exam    -  Primary   Relevant Orders   CBC with Differential/Platelet   Comprehensive metabolic panel with GFR   Hemoglobin A1c    Lipid panel   TSH     Encounter for screening mammogram for breast cancer       due in 04/2024     Colon cancer screening       Relevant Orders   Fecal occult blood, imunochemical       Return in about 6 months (around 03/14/2024) for HTN.    Reubin Milan, MD [facility] [place]

## 2023-09-13 NOTE — Assessment & Plan Note (Signed)
 Managed with MTF for PCOS as well Continue diet and exercise. Lab Results  Component Value Date   HGBA1C 6.0 (H) 09/07/2022

## 2023-09-13 NOTE — Assessment & Plan Note (Signed)
 LDL is  Lab Results  Component Value Date   LDLCALC 95 09/07/2022   Current regimen is simvastatin .  No medication side effects noted. Goal LDL is <70.

## 2023-09-13 NOTE — Assessment & Plan Note (Signed)
 Blood pressure is well controlled.  Current medications enalapril and spironolactone. Will continue same regimen along with efforts to limit dietary sodium.

## 2023-09-13 NOTE — Assessment & Plan Note (Signed)
 Clinically stable on Celexa.   No SI or HI on evaluation. Plan to continue same medications for now.

## 2023-09-13 NOTE — Assessment & Plan Note (Signed)
 On MTF and spironolactone. She declines pap and pelvic due to lack of symptoms

## 2023-09-14 LAB — MICROSCOPIC EXAMINATION
Bacteria, UA: NONE SEEN
Casts: NONE SEEN /LPF
Epithelial Cells (non renal): NONE SEEN /HPF (ref 0–10)
WBC, UA: NONE SEEN /HPF (ref 0–5)

## 2023-09-14 LAB — COMPREHENSIVE METABOLIC PANEL WITH GFR
ALT: 16 IU/L (ref 0–32)
AST: 23 IU/L (ref 0–40)
Albumin: 5 g/dL — ABNORMAL HIGH (ref 3.8–4.9)
Alkaline Phosphatase: 48 IU/L (ref 44–121)
BUN/Creatinine Ratio: 19 (ref 12–28)
BUN: 17 mg/dL (ref 8–27)
Bilirubin Total: 0.5 mg/dL (ref 0.0–1.2)
CO2: 21 mmol/L (ref 20–29)
Calcium: 10.6 mg/dL — ABNORMAL HIGH (ref 8.7–10.3)
Chloride: 98 mmol/L (ref 96–106)
Creatinine, Ser: 0.91 mg/dL (ref 0.57–1.00)
Globulin, Total: 2 g/dL (ref 1.5–4.5)
Glucose: 100 mg/dL — ABNORMAL HIGH (ref 70–99)
Potassium: 4.9 mmol/L (ref 3.5–5.2)
Sodium: 139 mmol/L (ref 134–144)
Total Protein: 7 g/dL (ref 6.0–8.5)
eGFR: 72 mL/min/{1.73_m2} (ref >59)

## 2023-09-14 LAB — TSH: TSH: 0.977 u[IU]/mL (ref 0.450–4.500)

## 2023-09-14 LAB — URINALYSIS, ROUTINE W REFLEX MICROSCOPIC
Bilirubin, UA: NEGATIVE
Glucose, UA: NEGATIVE
Nitrite, UA: NEGATIVE
Protein,UA: NEGATIVE
RBC, UA: NEGATIVE
Specific Gravity, UA: 1.022 (ref 1.005–1.030)
Urobilinogen, Ur: 0.2 mg/dL (ref 0.2–1.0)
pH, UA: 7.5 (ref 5.0–7.5)

## 2023-09-14 LAB — CBC WITH DIFFERENTIAL/PLATELET
Basophils Absolute: 0.2 10*3/uL (ref 0.0–0.2)
Basos: 2 %
EOS (ABSOLUTE): 0.8 10*3/uL — ABNORMAL HIGH (ref 0.0–0.4)
Eos: 11 %
Hematocrit: 43.9 % (ref 34.0–46.6)
Hemoglobin: 14.9 g/dL (ref 11.1–15.9)
Immature Grans (Abs): 0 10*3/uL (ref 0.0–0.1)
Immature Granulocytes: 0 %
Lymphocytes Absolute: 2.6 10*3/uL (ref 0.7–3.1)
Lymphs: 35 %
MCH: 29.8 pg (ref 26.6–33.0)
MCHC: 33.9 g/dL (ref 31.5–35.7)
MCV: 88 fL (ref 79–97)
Monocytes Absolute: 0.7 10*3/uL (ref 0.1–0.9)
Monocytes: 10 %
Neutrophils Absolute: 3.1 10*3/uL (ref 1.4–7.0)
Neutrophils: 42 %
Platelets: 422 10*3/uL (ref 150–450)
RBC: 5 x10E6/uL (ref 3.77–5.28)
RDW: 11.9 % (ref 11.7–15.4)
WBC: 7.4 10*3/uL (ref 3.4–10.8)

## 2023-09-14 LAB — LIPID PANEL
Chol/HDL Ratio: 3.3 ratio (ref 0.0–4.4)
Cholesterol, Total: 155 mg/dL (ref 100–199)
HDL: 47 mg/dL (ref >39)
LDL Chol Calc (NIH): 86 mg/dL (ref 0–99)
Triglycerides: 125 mg/dL (ref 0–149)
VLDL Cholesterol Cal: 22 mg/dL (ref 5–40)

## 2023-09-14 LAB — HEMOGLOBIN A1C
Est. average glucose Bld gHb Est-mCnc: 123 mg/dL
Hgb A1c MFr Bld: 5.9 % — ABNORMAL HIGH (ref 4.8–5.6)

## 2023-09-15 ENCOUNTER — Encounter: Payer: Self-pay | Admitting: Internal Medicine

## 2023-10-12 LAB — FECAL OCCULT BLOOD, IMMUNOCHEMICAL: Fecal Occult Bld: NEGATIVE

## 2024-01-15 ENCOUNTER — Other Ambulatory Visit: Payer: Self-pay | Admitting: Internal Medicine

## 2024-01-15 DIAGNOSIS — Z1231 Encounter for screening mammogram for malignant neoplasm of breast: Secondary | ICD-10-CM

## 2024-03-06 ENCOUNTER — Ambulatory Visit: Admitting: Internal Medicine

## 2024-04-10 ENCOUNTER — Ambulatory Visit
Admission: RE | Admit: 2024-04-10 | Discharge: 2024-04-10 | Disposition: A | Discharge status: Home / Self Care | Source: Ambulatory Visit | Attending: Internal Medicine | Admitting: Internal Medicine

## 2024-04-10 DIAGNOSIS — Z1231 Encounter for screening mammogram for malignant neoplasm of breast: Secondary | ICD-10-CM | POA: Diagnosis present
